# Patient Record
Sex: Male | Born: 1980 | Race: Black or African American | Hispanic: No | Marital: Single | Smoking: Never smoker
Health system: Southern US, Community
[De-identification: ages and names within clinical notes are randomized; demographics above are authoritative.]

## PROBLEM LIST (undated history)

## (undated) DIAGNOSIS — I10 Essential (primary) hypertension: Secondary | ICD-10-CM

## (undated) DIAGNOSIS — W19XXXA Unspecified fall, initial encounter: Secondary | ICD-10-CM

## (undated) HISTORY — PX: FINGER SURGERY: SHX640

---

## 2000-12-28 ENCOUNTER — Emergency Department (HOSPITAL_COMMUNITY): Admission: EM | Admit: 2000-12-28 | Discharge: 2000-12-28 | Payer: Self-pay | Admitting: Emergency Medicine

## 2000-12-28 ENCOUNTER — Encounter: Payer: Self-pay | Admitting: Emergency Medicine

## 2003-08-03 ENCOUNTER — Emergency Department (HOSPITAL_COMMUNITY): Admission: EM | Admit: 2003-08-03 | Discharge: 2003-08-03 | Payer: Self-pay | Admitting: Emergency Medicine

## 2003-08-03 ENCOUNTER — Encounter: Payer: Self-pay | Admitting: *Deleted

## 2006-05-29 ENCOUNTER — Emergency Department (HOSPITAL_COMMUNITY): Admission: EM | Admit: 2006-05-29 | Discharge: 2006-05-29 | Payer: Self-pay | Admitting: Emergency Medicine

## 2006-09-12 ENCOUNTER — Emergency Department (HOSPITAL_COMMUNITY): Admission: EM | Admit: 2006-09-12 | Discharge: 2006-09-12 | Payer: Self-pay | Admitting: Emergency Medicine

## 2007-02-07 ENCOUNTER — Emergency Department (HOSPITAL_COMMUNITY): Admission: EM | Admit: 2007-02-07 | Discharge: 2007-02-07 | Payer: Self-pay | Admitting: Emergency Medicine

## 2007-05-13 ENCOUNTER — Emergency Department (HOSPITAL_COMMUNITY): Admission: EM | Admit: 2007-05-13 | Discharge: 2007-05-13 | Payer: Self-pay | Admitting: Emergency Medicine

## 2009-02-16 ENCOUNTER — Emergency Department (HOSPITAL_COMMUNITY): Admission: EM | Admit: 2009-02-16 | Discharge: 2009-02-16 | Payer: Self-pay | Admitting: Emergency Medicine

## 2009-02-19 ENCOUNTER — Emergency Department (HOSPITAL_COMMUNITY): Admission: EM | Admit: 2009-02-19 | Discharge: 2009-02-19 | Payer: Self-pay | Admitting: Emergency Medicine

## 2009-02-25 ENCOUNTER — Emergency Department (HOSPITAL_COMMUNITY): Admission: EM | Admit: 2009-02-25 | Discharge: 2009-02-26 | Payer: Self-pay | Admitting: Emergency Medicine

## 2011-03-18 LAB — DIFFERENTIAL
Basophils Absolute: 0 10*3/uL (ref 0.0–0.1)
Basophils Relative: 0 % (ref 0–1)
Eosinophils Absolute: 0.3 10*3/uL (ref 0.0–0.7)
Eosinophils Relative: 4 % (ref 0–5)
Monocytes Absolute: 0.4 10*3/uL (ref 0.1–1.0)
Monocytes Relative: 6 % (ref 3–12)
Neutro Abs: 6 10*3/uL (ref 1.7–7.7)

## 2011-03-18 LAB — URINALYSIS, ROUTINE W REFLEX MICROSCOPIC
Bilirubin Urine: NEGATIVE
Glucose, UA: NEGATIVE mg/dL
Hgb urine dipstick: NEGATIVE
Nitrite: NEGATIVE
Protein, ur: NEGATIVE mg/dL
Specific Gravity, Urine: 1.014 (ref 1.005–1.030)
Urobilinogen, UA: 0.2 mg/dL (ref 0.0–1.0)
pH: 6 (ref 5.0–8.0)

## 2011-03-18 LAB — CBC
HCT: 42.3 % (ref 39.0–52.0)
Hemoglobin: 14.3 g/dL (ref 13.0–17.0)
MCHC: 33.8 g/dL (ref 30.0–36.0)
MCV: 81.9 fL (ref 78.0–100.0)
Platelets: 158 10*3/uL (ref 150–400)
RBC: 5.16 MIL/uL (ref 4.22–5.81)
RDW: 14.1 % (ref 11.5–15.5)
WBC: 7.1 10*3/uL (ref 4.0–10.5)

## 2011-03-18 LAB — POCT I-STAT, CHEM 8
Calcium, Ion: 1.09 mmol/L — ABNORMAL LOW (ref 1.12–1.32)
Creatinine, Ser: 1 mg/dL (ref 0.4–1.5)
Glucose, Bld: 105 mg/dL — ABNORMAL HIGH (ref 70–99)
HCT: 44 % (ref 39.0–52.0)
Hemoglobin: 15 g/dL (ref 13.0–17.0)
TCO2: 21 mmol/L (ref 0–100)

## 2011-05-12 ENCOUNTER — Emergency Department (HOSPITAL_COMMUNITY)
Admission: EM | Admit: 2011-05-12 | Discharge: 2011-05-12 | Disposition: A | Discharge status: Home / Self Care | Payer: Self-pay | Attending: Emergency Medicine | Admitting: Emergency Medicine

## 2011-05-12 ENCOUNTER — Emergency Department (HOSPITAL_COMMUNITY): Payer: Self-pay

## 2011-05-12 DIAGNOSIS — J069 Acute upper respiratory infection, unspecified: Secondary | ICD-10-CM | POA: Insufficient documentation

## 2011-05-12 DIAGNOSIS — J40 Bronchitis, not specified as acute or chronic: Secondary | ICD-10-CM | POA: Insufficient documentation

## 2011-05-12 DIAGNOSIS — R059 Cough, unspecified: Secondary | ICD-10-CM | POA: Insufficient documentation

## 2011-05-12 DIAGNOSIS — I10 Essential (primary) hypertension: Secondary | ICD-10-CM | POA: Insufficient documentation

## 2011-05-12 DIAGNOSIS — R05 Cough: Secondary | ICD-10-CM | POA: Insufficient documentation

## 2013-01-15 ENCOUNTER — Encounter (HOSPITAL_COMMUNITY): Payer: Self-pay | Admitting: Emergency Medicine

## 2013-01-15 ENCOUNTER — Emergency Department (HOSPITAL_COMMUNITY)
Admission: EM | Admit: 2013-01-15 | Discharge: 2013-01-15 | Disposition: A | Discharge status: Home / Self Care | Payer: Self-pay | Attending: Emergency Medicine | Admitting: Emergency Medicine

## 2013-01-15 DIAGNOSIS — Z79899 Other long term (current) drug therapy: Secondary | ICD-10-CM | POA: Insufficient documentation

## 2013-01-15 DIAGNOSIS — R0789 Other chest pain: Secondary | ICD-10-CM | POA: Insufficient documentation

## 2013-01-15 DIAGNOSIS — R52 Pain, unspecified: Secondary | ICD-10-CM | POA: Insufficient documentation

## 2013-01-15 DIAGNOSIS — J3489 Other specified disorders of nose and nasal sinuses: Secondary | ICD-10-CM | POA: Insufficient documentation

## 2013-01-15 DIAGNOSIS — J069 Acute upper respiratory infection, unspecified: Secondary | ICD-10-CM | POA: Insufficient documentation

## 2013-01-15 DIAGNOSIS — I10 Essential (primary) hypertension: Secondary | ICD-10-CM | POA: Insufficient documentation

## 2013-01-15 DIAGNOSIS — R6889 Other general symptoms and signs: Secondary | ICD-10-CM

## 2013-01-15 HISTORY — DX: Essential (primary) hypertension: I10

## 2013-01-15 MED ORDER — HYDROCOD POLST-CHLORPHEN POLST 10-8 MG/5ML PO LQCR
5.0000 mL | Freq: Once | ORAL | Status: AC
  Administered 2013-01-15: 5 mL via ORAL
  Filled 2013-01-15: qty 5

## 2013-01-15 MED ORDER — FLUTICASONE PROPIONATE 50 MCG/ACT NA SUSP: 2.0000 | Freq: Every day | NASAL | Status: DC

## 2013-01-15 MED ORDER — IBUPROFEN 400 MG PO TABS
800.0000 mg | ORAL_TABLET | Freq: Once | ORAL | Status: AC
  Administered 2013-01-15: 800 mg via ORAL
  Filled 2013-01-15: qty 2

## 2013-01-15 MED ORDER — HYDROCODONE-HOMATROPINE 5-1.5 MG/5ML PO SYRP: 5.0000 mL | ORAL_SOLUTION | Freq: Four times a day (QID) | ORAL | Status: DC | PRN

## 2013-01-15 MED ORDER — NAPROXEN 500 MG PO TABS: 500.0000 mg | ORAL_TABLET | Freq: Two times a day (BID) | ORAL | Status: DC

## 2013-01-15 NOTE — ED Provider Notes (Signed)
History     CSN: [id number]  Arrival date & time 01/15/13  4401   First MD Initiated Contact with Patient 01/15/13 1014      Chief Complaint  Patient presents with  . URI  . Cough  . Generalized Body Aches    (Consider location/radiation/quality/duration/timing/severity/associated sxs/prior treatment) Patient is a 32 y.o. male presenting with URI. The history is provided by the spouse.  URI Presenting symptoms: congestion, cough and rhinorrhea   Presenting symptoms: no ear pain, no facial pain, no fatigue, no fever and no sore throat   Associated symptoms: myalgias   Associated symptoms: no arthralgias, no headaches, no neck pain, no sinus pain, no sneezing, no swollen glands and no wheezing     Past Medical History  Diagnosis Date  . Hypertension     History reviewed. No pertinent past surgical history.  History reviewed. No pertinent family history.  History  Substance Use Topics  . Smoking status: Never Smoker   . Smokeless tobacco: Not on file  . Alcohol Use: Yes     Comment: occ      Review of Systems  Constitutional: Negative for fever, chills, appetite change and fatigue.  HENT: Positive for congestion and rhinorrhea. Negative for hearing loss, ear pain, nosebleeds, sore throat, sneezing, trouble swallowing, neck pain, neck stiffness, voice change, postnasal drip, sinus pressure, tinnitus and ear discharge.   Eyes: Negative for photophobia and visual disturbance.  Respiratory: Positive for cough and chest tightness. Negative for apnea, choking, shortness of breath, wheezing and stridor.   Cardiovascular: Negative for chest pain, palpitations and leg swelling.  Gastrointestinal: Negative for nausea, vomiting, abdominal pain, diarrhea and constipation.  Genitourinary: Negative for dysuria, urgency and flank pain.  Musculoskeletal: Positive for myalgias. Negative for arthralgias.  Neurological: Negative for dizziness, seizures, syncope, weakness,  light-headedness, numbness and headaches.  Psychiatric/Behavioral: Negative for behavioral problems and confusion.  All other systems reviewed and are negative.    Allergies  Acetaminophen and Septra  Home Medications   Current Outpatient Rx  Name  Route  Sig  Dispense  Refill  . calcium carbonate (OS-CAL) 600 MG TABS   Oral   Take 600 mg by mouth 2 (two) times daily with a meal.         . vitamin C (ASCORBIC ACID) 500 MG tablet   Oral   Take 500 mg by mouth daily.           BP 150/75  Pulse 55  Temp(Src) 98.4 F (36.9 C) (Oral)  Resp 20  SpO2 98%  Physical Exam  Nursing note and vitals reviewed. Constitutional: He is oriented to person, place, and time. He appears well-developed and well-nourished. No distress.  HENT:  Head: Normocephalic and atraumatic.  Nasal congestion & rhinorrhea. Mild ttp over frontal & maxilla sinuses. Normal L & R external ear canals and TMs. No tragal or mastoid tenderness. Oropharynx moist, without tonsillar exudate.   Eyes: Pupils are equal, round, and reactive to light.  No pain w EOM. Conjunctiva normal    Neck: Normal range of motion.  Soft, no nuchal rigidity. No lymphadenopathy  Cardiovascular: Normal heart sounds and intact distal pulses.   RRR, no aberrancy on auscultation  Pulmonary/Chest: Effort normal.  LCAB, no respiratory distress  Musculoskeletal: Normal range of motion.  Neurological: He is alert and oriented to person, place, and time.  Skin: He is not diaphoretic.  No rash  Psychiatric: His behavior is normal.    ED Course  Procedures (including critical  care time)  Labs Reviewed - No data to display No results found.   No diagnosis found.    MDM  Pt to ER with flulike s/s. LCAB with normal VS on exam, imaging not indicated at this time. Patients symptoms are consistent with URI, likely viral etiology. Discussed that antibiotics are not indicated for viral infections. Pt will be discharged with  symptomatic treatment.  Verbalizes understanding and is agreeable with plan. Pt is hemodynamically stable & in NAD prior to dc.         Jaci Carrel, [place] 01/15/13 1049

## 2013-01-15 NOTE — ED Notes (Signed)
Pt c/o dry cough x 3 days with body aches

## 2013-01-15 NOTE — ED Provider Notes (Signed)
Medical screening examination/treatment/procedure(s) were performed by non-physician practitioner and as supervising physician I was immediately available for consultation/collaboration.   Makailee Nudelman M Caitlin Ainley, DO 01/15/13 2117 

## 2013-03-29 ENCOUNTER — Encounter (HOSPITAL_COMMUNITY): Payer: Self-pay | Admitting: *Deleted

## 2013-03-29 ENCOUNTER — Emergency Department (HOSPITAL_COMMUNITY)
Admission: EM | Admit: 2013-03-29 | Discharge: 2013-03-29 | Disposition: A | Discharge status: Home / Self Care | Payer: Self-pay | Attending: Emergency Medicine | Admitting: Emergency Medicine

## 2013-03-29 DIAGNOSIS — Z79899 Other long term (current) drug therapy: Secondary | ICD-10-CM | POA: Insufficient documentation

## 2013-03-29 DIAGNOSIS — IMO0002 Reserved for concepts with insufficient information to code with codable children: Secondary | ICD-10-CM | POA: Insufficient documentation

## 2013-03-29 DIAGNOSIS — R062 Wheezing: Secondary | ICD-10-CM | POA: Insufficient documentation

## 2013-03-29 DIAGNOSIS — J3489 Other specified disorders of nose and nasal sinuses: Secondary | ICD-10-CM | POA: Insufficient documentation

## 2013-03-29 DIAGNOSIS — R21 Rash and other nonspecific skin eruption: Secondary | ICD-10-CM | POA: Insufficient documentation

## 2013-03-29 DIAGNOSIS — L02219 Cutaneous abscess of trunk, unspecified: Secondary | ICD-10-CM | POA: Insufficient documentation

## 2013-03-29 DIAGNOSIS — R52 Pain, unspecified: Secondary | ICD-10-CM | POA: Insufficient documentation

## 2013-03-29 DIAGNOSIS — R509 Fever, unspecified: Secondary | ICD-10-CM | POA: Insufficient documentation

## 2013-03-29 DIAGNOSIS — I10 Essential (primary) hypertension: Secondary | ICD-10-CM | POA: Insufficient documentation

## 2013-03-29 DIAGNOSIS — L03311 Cellulitis of abdominal wall: Secondary | ICD-10-CM

## 2013-03-29 DIAGNOSIS — J029 Acute pharyngitis, unspecified: Secondary | ICD-10-CM | POA: Insufficient documentation

## 2013-03-29 DIAGNOSIS — L089 Local infection of the skin and subcutaneous tissue, unspecified: Secondary | ICD-10-CM | POA: Insufficient documentation

## 2013-03-29 DIAGNOSIS — J069 Acute upper respiratory infection, unspecified: Secondary | ICD-10-CM

## 2013-03-29 MED ORDER — CLINDAMYCIN HCL 300 MG PO CAPS
300.0000 mg | ORAL_CAPSULE | Freq: Once | ORAL | Status: AC
  Administered 2013-03-29: 300 mg via ORAL
  Filled 2013-03-29: qty 1

## 2013-03-29 MED ORDER — ONDANSETRON 4 MG PO TBDP
8.0000 mg | ORAL_TABLET | Freq: Once | ORAL | Status: AC
  Administered 2013-03-29: 8 mg via ORAL

## 2013-03-29 MED ORDER — CLINDAMYCIN HCL 150 MG PO CAPS: 150.0000 mg | ORAL_CAPSULE | Freq: Four times a day (QID) | ORAL | Status: DC

## 2013-03-29 MED ORDER — PROMETHAZINE HCL 25 MG PO TABS: 25.0000 mg | ORAL_TABLET | Freq: Four times a day (QID) | ORAL | Status: DC | PRN

## 2013-03-29 MED ORDER — ONDANSETRON 4 MG PO TBDP
ORAL_TABLET | ORAL | Status: AC
  Filled 2013-03-29: qty 2

## 2013-03-29 MED ORDER — IBUPROFEN 800 MG PO TABS: 800.0000 mg | ORAL_TABLET | Freq: Three times a day (TID) | ORAL | Status: DC

## 2013-03-29 MED ORDER — IBUPROFEN 800 MG PO TABS
800.0000 mg | ORAL_TABLET | Freq: Once | ORAL | Status: AC
  Administered 2013-03-29: 800 mg via ORAL
  Filled 2013-03-29: qty 1

## 2013-03-29 MED ORDER — ALBUTEROL SULFATE HFA 108 (90 BASE) MCG/ACT IN AERS
2.0000 | INHALATION_SPRAY | RESPIRATORY_TRACT | Status: DC | PRN
  Administered 2013-03-29: 2 via RESPIRATORY_TRACT
  Filled 2013-03-29: qty 6.7

## 2013-03-29 NOTE — ED Notes (Signed)
Prior to leaving pt became nauseous due to the cleocin, pt vomited.  Notified EDP Opitz.  Obtained rx for phenergan and verbal order for 8mg  ODT zofran.

## 2013-03-29 NOTE — ED Provider Notes (Signed)
History     CSN: [id number]  Arrival date & time 03/29/13  4098   First MD Initiated Contact with Patient 03/29/13 0536      No chief complaint on file.   (Consider location/radiation/quality/duration/timing/severity/associated sxs/prior treatment) HPI Hx per PT. Cough, congestion, sore throat x 3 days, his wife has had the same symptoms, now he also has chills and body aches with recurrent skin infection on his Abdominal wall. No trauma, recent travel or vomiting. He has had these before and required antibiotics for cellulitis. Symptoms moderate in severity.  Past Medical History  Diagnosis Date  . Hypertension     History reviewed. No pertinent past surgical history.  No family history on file.  History  Substance Use Topics  . Smoking status: Never Smoker   . Smokeless tobacco: Not on file  . Alcohol Use: Yes     Comment: occ      Review of Systems  Constitutional: Positive for fever and chills.  HENT: Positive for congestion and sore throat. Negative for neck pain and neck stiffness.   Eyes: Negative for pain.  Respiratory: Positive for cough and wheezing. Negative for shortness of breath.   Cardiovascular: Negative for chest pain.  Gastrointestinal: Negative for vomiting and abdominal pain.  Genitourinary: Negative for dysuria.  Musculoskeletal: Negative for back pain.  Skin: Positive for rash.  Neurological: Negative for headaches.  All other systems reviewed and are negative.    Allergies  Acetaminophen and Septra  Home Medications   Current Outpatient Rx  Name  Route  Sig  Dispense  Refill  . calcium carbonate (OS-CAL) 600 MG TABS   Oral   Take 600 mg by mouth 2 (two) times daily with a meal.         . fluticasone (FLONASE) 50 MCG/ACT nasal spray   Nasal   Place 2 sprays into the nose daily.   16 g   2   . HYDROcodone-homatropine (HYCODAN) 5-1.5 MG/5ML syrup   Oral   Take 5 mLs by mouth every 6 (six) hours as needed for cough.   120 mL  0   . naproxen (NAPROSYN) 500 MG tablet   Oral   Take 1 tablet (500 mg total) by mouth 2 (two) times daily.   30 tablet   0   . vitamin C (ASCORBIC ACID) 500 MG tablet   Oral   Take 500 mg by mouth daily.           BP 145/82  Pulse 82  Temp(Src) 99 F (37.2 C) (Oral)  SpO2 100%  Physical Exam  Constitutional: He is oriented to person, place, and time. He appears well-developed and well-nourished.  HENT:  Head: Normocephalic and atraumatic.  Mouth/Throat: Oropharynx is clear and moist.  Nasal congestion, uvula midline, no tonsillar exudates  Eyes: EOM are normal. Pupils are equal, round, and reactive to light. No scleral icterus.  Neck: Normal range of motion. Neck supple.  Cardiovascular: Normal rate, regular rhythm and intact distal pulses.   Pulmonary/Chest: Effort normal. No stridor. No respiratory distress.  Mild exp wheezes bilat, otherwise moving air well  Abdominal: Soft. Bowel sounds are normal. He exhibits no distension. There is no rebound and no guarding.  Musculoskeletal: Normal range of motion. He exhibits no edema.  Lymphadenopathy:    He has no cervical adenopathy.  Neurological: He is alert and oriented to person, place, and time.  Skin: Skin is warm and dry.  Erythematous tender area to RLQ ABD wall, no fluctuance  or pointing    ED Course  Procedures (including critical care time)  clindamycin PO  Albuterol and motrin provided   MDM  URI symptoms and also has cellulitis Abdominal wall.   Plan ABX and recheck 48 hours  Inhaler and motrin for URI  VS and nursing notes reviewed  Cellulitis precautions provided        Sunnie Nielsen, MD 03/29/13 0630

## 2013-03-29 NOTE — ED Notes (Signed)
Per Pt:  Pt st's he caught an illness from his wife.  St's he's had sore throat and cough for the past 3-4 days.  Reports productive cough.  Denies chest pain, SOB, n/v/d.  St's he took cough medicine without relief.

## 2013-06-17 ENCOUNTER — Emergency Department (HOSPITAL_COMMUNITY): Payer: Self-pay

## 2013-06-17 ENCOUNTER — Emergency Department (HOSPITAL_COMMUNITY)
Admission: EM | Admit: 2013-06-17 | Discharge: 2013-06-17 | Disposition: A | Discharge status: Home / Self Care | Payer: Self-pay | Attending: Emergency Medicine | Admitting: Emergency Medicine

## 2013-06-17 ENCOUNTER — Encounter (HOSPITAL_COMMUNITY): Payer: Self-pay | Admitting: Emergency Medicine

## 2013-06-17 DIAGNOSIS — G8929 Other chronic pain: Secondary | ICD-10-CM | POA: Insufficient documentation

## 2013-06-17 DIAGNOSIS — R112 Nausea with vomiting, unspecified: Secondary | ICD-10-CM | POA: Insufficient documentation

## 2013-06-17 DIAGNOSIS — I10 Essential (primary) hypertension: Secondary | ICD-10-CM | POA: Insufficient documentation

## 2013-06-17 DIAGNOSIS — K5900 Constipation, unspecified: Secondary | ICD-10-CM | POA: Insufficient documentation

## 2013-06-17 DIAGNOSIS — R1084 Generalized abdominal pain: Secondary | ICD-10-CM | POA: Insufficient documentation

## 2013-06-17 DIAGNOSIS — R109 Unspecified abdominal pain: Secondary | ICD-10-CM

## 2013-06-17 DIAGNOSIS — Z79899 Other long term (current) drug therapy: Secondary | ICD-10-CM | POA: Insufficient documentation

## 2013-06-17 LAB — COMPREHENSIVE METABOLIC PANEL
AST: 19 U/L (ref 0–37)
Albumin: 3.9 g/dL (ref 3.5–5.2)
Alkaline Phosphatase: 67 U/L (ref 39–117)
BUN: 9 mg/dL (ref 6–23)
Potassium: 3.7 mEq/L (ref 3.5–5.1)
Total Protein: 7.9 g/dL (ref 6.0–8.3)

## 2013-06-17 LAB — CBC WITH DIFFERENTIAL/PLATELET
Basophils Absolute: 0 10*3/uL (ref 0.0–0.1)
Basophils Relative: 0 % (ref 0–1)
Eosinophils Absolute: 0.2 10*3/uL (ref 0.0–0.7)
Hemoglobin: 14.4 g/dL (ref 13.0–17.0)
MCH: 29.6 pg (ref 26.0–34.0)
MCHC: 36.6 g/dL — ABNORMAL HIGH (ref 30.0–36.0)
Monocytes Relative: 6 % (ref 3–12)
Neutro Abs: 7.6 10*3/uL (ref 1.7–7.7)
Neutrophils Relative %: 83 % — ABNORMAL HIGH (ref 43–77)
Platelets: 158 10*3/uL (ref 150–400)
RDW: 13 % (ref 11.5–15.5)

## 2013-06-17 MED ORDER — DICYCLOMINE HCL 20 MG PO TABS: 20.0000 mg | ORAL_TABLET | Freq: Two times a day (BID) | ORAL | Status: DC

## 2013-06-17 NOTE — ED Provider Notes (Signed)
History    CSN: [id number] Arrival date & time 06/17/13  1506  First MD Initiated Contact with Patient 06/17/13 1512     Chief Complaint  Patient presents with  . Abdominal Pain  . Nausea   (Consider location/radiation/quality/duration/timing/severity/associated sxs/prior Treatment) HPI Comments: Patient presents with abdominal pain that has been ongoing for the past 2-3 years.  He reports that the pain is generalized and feels like intense cramping.  The pain is typically relieved with having a bowel movement.  He states that he does have bowel movements daily.  He reports that his bowel movements are typically soft, but occasionally hard in consistency.  He denies diarrhea. He reports that earlier today the pain became very intense, which prompted him to come to the ED.  He reports that he did have one episode of vomiting at that time.  However, he had a bowel movement and his pain has completely resolved at this time.  He denies any nausea at this time.  No fever or chills.  He reports that he has not been evaluated by a physician for this pain previously.  Patient is a 32 y.o. male presenting with abdominal pain.  Abdominal Pain Associated symptoms include abdominal pain, nausea and vomiting. Pertinent negatives include no chills, fever or urinary symptoms.   Past Medical History  Diagnosis Date  . Hypertension    History reviewed. No pertinent past surgical history. History reviewed. No pertinent family history. History  Substance Use Topics  . Smoking status: Never Smoker   . Smokeless tobacco: Not on file  . Alcohol Use: Yes     Comment: occ    Review of Systems  Constitutional: Negative for fever and chills.  Gastrointestinal: Positive for nausea, vomiting, abdominal pain and constipation. Negative for blood in stool, abdominal distention and anal bleeding.  All other systems reviewed and are negative.    Allergies  Acetaminophen and Septra  Home Medications    Current Outpatient Rx  Name  Route  Sig  Dispense  Refill  . calcium carbonate (OS-CAL) 600 MG TABS   Oral   Take 600 mg by mouth 2 (two) times daily with a meal.         . clindamycin (CLEOCIN) 150 MG capsule   Oral   Take 1 capsule (150 mg total) by mouth every 6 (six) hours.   28 capsule   0   . fluticasone (FLONASE) 50 MCG/ACT nasal spray   Nasal   Place 2 sprays into the nose daily.   16 g   2   . HYDROcodone-homatropine (HYCODAN) 5-1.5 MG/5ML syrup   Oral   Take 5 mLs by mouth every 6 (six) hours as needed for cough.   120 mL   0   . ibuprofen (ADVIL,MOTRIN) 800 MG tablet   Oral   Take 1 tablet (800 mg total) by mouth 3 (three) times daily.   21 tablet   0   . naproxen (NAPROSYN) 500 MG tablet   Oral   Take 1 tablet (500 mg total) by mouth 2 (two) times daily.   30 tablet   0   . promethazine (PHENERGAN) 25 MG tablet   Oral   Take 1 tablet (25 mg total) by mouth every 6 (six) hours as needed for nausea.   30 tablet   0   . vitamin C (ASCORBIC ACID) 500 MG tablet   Oral   Take 500 mg by mouth daily.  BP 135/83  Pulse 67  Temp(Src) 98.7 F (37.1 C) (Oral)  Resp 16  SpO2 94% Physical Exam  Nursing note and vitals reviewed. Constitutional: He appears well-developed and well-nourished. No distress.  HENT:  Head: Normocephalic and atraumatic.  Mouth/Throat: Oropharynx is clear and moist.  Neck: Normal range of motion. Neck supple.  Cardiovascular: Normal rate, regular rhythm and normal heart sounds.   Pulmonary/Chest: Effort normal and breath sounds normal.  Abdominal: Soft. Bowel sounds are normal. He exhibits no distension and no mass. There is no tenderness. There is no rebound and no guarding.  Neurological: He is alert.  Skin: Skin is warm and dry. He is not diaphoretic.  Psychiatric: He has a normal mood and affect.    ED Course  Procedures (including critical care time) Labs Reviewed  URINALYSIS, ROUTINE W REFLEX  MICROSCOPIC  CBC WITH DIFFERENTIAL  COMPREHENSIVE METABOLIC PANEL   Dg Abd Acute W/chest  06/17/2013   *RADIOLOGY REPORT*  Clinical Data: Chronic abdominal pain.  Nausea and vomiting today.  ACUTE ABDOMEN SERIES (ABDOMEN 2 VIEW & CHEST 1 VIEW)  Comparison: Plain films of the chest 05/12/2011.  Findings: Single view of the chest demonstrates clear lungs and normal heart size.  No pneumothorax or pleural fluid.  Two views of the abdomen show no free intraperitoneal air.  The bowel gas pattern is normal.  Convex left lumbar scoliosis is noted.  IMPRESSION: No acute finding.   Original Report Authenticated By: Holley Dexter, M.D.   No diagnosis found.  MDM  Patient presenting with chronic abdominal pain.  On exam today in the ED he did not have any abdominal tenderness to palpation.  He reports that pain typically resolves after having a BM.  Patient is afebrile.  Acute Abdominal series unremarkable.  Labs unremarkable.  Due to the chronic nature of the pain and the fact that he currently is not any abdominal pain, doubt surgical abdomen.  It is possible that the pain is related to IBS.  Patient given Rx for Bentyl and referred to GI.  Pascal Lux [place], PA-C 06/18/13 1323

## 2013-06-17 NOTE — ED Notes (Signed)
Per EMS: pt states when he is exerted pt has onset of abdominal pain and will get short of breath with nausea. Pt states today he tried his usual methods and did not work today. Pt states today was different because he began to feel weak all over. Stroke screen eng per EMS. CBG 113 BP 140/80 64 pulse 18 RR O2 sat 98% RA. Pt denies pain currently. Pt denies nausea. Pt alert and oriented. 20 in left hand. No medications given en route.

## 2013-06-17 NOTE — ED Notes (Signed)
Magnus Sinning, PA speaking with pt

## 2013-06-17 NOTE — ED Notes (Signed)
Pt alert and mentating appropriately. Pt given d/c teaching and follow up care instructions with prescriptions. Pt verbalizes understanding of d/c teaching and has no further questions upon d/c. NAD noted upon d/c. Pt ambulatory leaving ER with family members. Pt denies pain upon d/c.

## 2013-06-18 NOTE — ED Provider Notes (Signed)
Medical screening examination/treatment/procedure(s) were performed by non-physician practitioner and as supervising physician I was immediately available for consultation/collaboration.   Carleene Cooper III, MD 06/18/13 [phone]

## 2013-10-24 ENCOUNTER — Emergency Department (INDEPENDENT_AMBULATORY_CARE_PROVIDER_SITE_OTHER)
Admission: EM | Admit: 2013-10-24 | Discharge: 2013-10-24 | Disposition: A | Discharge status: Home / Self Care | Payer: Self-pay | Source: Home / Self Care | Attending: Family Medicine | Admitting: Family Medicine

## 2013-10-24 ENCOUNTER — Encounter (HOSPITAL_COMMUNITY): Payer: Self-pay | Admitting: Emergency Medicine

## 2013-10-24 DIAGNOSIS — J069 Acute upper respiratory infection, unspecified: Secondary | ICD-10-CM

## 2013-10-24 MED ORDER — AZITHROMYCIN 250 MG PO TABS: 250.0000 mg | ORAL_TABLET | Freq: Every day | ORAL | Status: DC

## 2013-10-24 MED ORDER — IPRATROPIUM BROMIDE 0.06 % NA SOLN: 2.0000 | Freq: Four times a day (QID) | NASAL | Status: DC

## 2013-10-24 NOTE — ED Provider Notes (Signed)
Mark Young is a 32 y.o. male who presents to Urgent Care today for 4-5 days of cough congestion sore throat and runny nose. He's tried Robitussin and warm tea. He is feeling somewhat better and is be due to return to work. Denies any chest pain or shortness of breath nausea vomiting or diarrhea. He feels well otherwise. He is allergic to Tylenol.   Past Medical History  Diagnosis Date  . Hypertension    History  Substance Use Topics  . Smoking status: Never Smoker   . Smokeless tobacco: Not on file  . Alcohol Use: Yes     Comment: occ   ROS as above Medications reviewed. No current facility-administered medications for this encounter.   Current Outpatient Prescriptions  Medication Sig Dispense Refill  . azithromycin (ZITHROMAX) 250 MG tablet Take 1 tablet (250 mg total) by mouth daily. Take first 2 tablets together, then 1 every day until finished.  6 tablet  0  . calcium carbonate (OS-CAL) 600 MG TABS Take 600 mg by mouth 2 (two) times daily with a meal.      . dicyclomine (BENTYL) 20 MG tablet Take 1 tablet (20 mg total) by mouth 2 (two) times daily.  20 tablet  0  . fluticasone (FLONASE) 50 MCG/ACT nasal spray Place 2 sprays into the nose daily.  16 g  2  . ibuprofen (ADVIL,MOTRIN) 800 MG tablet Take 800 mg by mouth daily as needed.      Marland Kitchen ipratropium (ATROVENT) 0.06 % nasal spray Place 2 sprays into both nostrils 4 (four) times daily.  15 mL  1  . promethazine (PHENERGAN) 25 MG tablet Take 1 tablet (25 mg total) by mouth every 6 (six) hours as needed for nausea.  30 tablet  0  . vitamin C (ASCORBIC ACID) 500 MG tablet Take 500 mg by mouth daily.        Exam:  BP 153/99  Pulse 76  Temp(Src) 98.9 F (37.2 C) (Oral)  Resp 16  SpO2 100% Gen: Well NAD HEENT: EOMI,  MMM, posterior pharynx with mild cobblestoning. Tympanic membranes are normal appearing bilaterally. Clear nasal discharge for Lungs: CTABL Nl WOB Heart: RRR no MRG Abd: NABS, NT, ND Exts: Non edematous BL   LE, warm and well perfused.   No results found for this or any previous visit (from the past 24 hour(s)). No results found.  Assessment and Plan: 32 y.o. male with viral URI. Plan for symptomatic management with Atrovent nasal spray. Additionally his ibuprofen as needed. We'll prescribe azithromycin for use if patient does not improve.  Discussed warning signs or symptoms. Please see discharge instructions. Patient expresses understanding.      Rodolph Bong, MD 10/24/13 [phone]

## 2013-10-24 NOTE — ED Notes (Signed)
Dr. Denyse Amass is w/pt  Pt c/o cold sxs onset 4-5 days Sxs include: cough, sneezing, congestion, runny nose Reports he feels better but needs to be cleared to go back to work Has been taking Robitussin and Hot tea w/no temp relief.  Alert w/no signs of acute distress

## 2014-05-28 ENCOUNTER — Encounter (HOSPITAL_COMMUNITY): Payer: Self-pay | Admitting: Emergency Medicine

## 2014-05-28 ENCOUNTER — Emergency Department (INDEPENDENT_AMBULATORY_CARE_PROVIDER_SITE_OTHER)
Admission: EM | Admit: 2014-05-28 | Discharge: 2014-05-28 | Disposition: A | Discharge status: Home / Self Care | Payer: Self-pay | Source: Home / Self Care | Attending: Family Medicine | Admitting: Family Medicine

## 2014-05-28 DIAGNOSIS — S335XXA Sprain of ligaments of lumbar spine, initial encounter: Secondary | ICD-10-CM

## 2014-05-28 LAB — POCT URINALYSIS DIP (DEVICE)
Bilirubin Urine: NEGATIVE
Glucose, UA: NEGATIVE mg/dL
Hgb urine dipstick: NEGATIVE
KETONES UR: NEGATIVE mg/dL
LEUKOCYTES UA: NEGATIVE
NITRITE: NEGATIVE
PROTEIN: NEGATIVE mg/dL
Specific Gravity, Urine: 1.02 (ref 1.005–1.030)
Urobilinogen, UA: 0.2 mg/dL (ref 0.0–1.0)
pH: 5.5 (ref 5.0–8.0)

## 2014-05-28 MED ORDER — CYCLOBENZAPRINE HCL 5 MG PO TABS: 5.0000 mg | ORAL_TABLET | Freq: Three times a day (TID) | ORAL | Status: DC | PRN

## 2014-05-28 MED ORDER — IBUPROFEN 800 MG PO TABS: 800.0000 mg | ORAL_TABLET | Freq: Three times a day (TID) | ORAL | Status: DC

## 2014-05-28 NOTE — ED Notes (Signed)
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  Alert w/no signs of acute distress.

## 2014-05-28 NOTE — ED Provider Notes (Signed)
CSN: [id number]     Arrival date & time 05/28/14  0831 History   First MD Initiated Contact with Patient 05/28/14 [phone]     Chief Complaint  Patient presents with  . Back Pain   (Consider location/radiation/quality/duration/timing/severity/associated sxs/prior Treatment) Patient is a 33 y.o. male presenting with back pain. The history is provided by the patient.  Back Pain Location:  Lumbar spine Quality:  Stiffness and shooting Radiates to:  Does not radiate Pain severity:  Moderate Onset quality:  Gradual Duration:  4 days Progression:  Unchanged Chronicity:  New Context: lifting heavy objects   Context comment:  Works Location managerconstruction pouring concrete etc, felt it on fri. Worsened by:  Movement and touching Associated symptoms: no abdominal pain, no dysuria, no fever, no leg pain, no numbness and no tingling     Past Medical History  Diagnosis Date  . Hypertension    History reviewed. No pertinent past surgical history. No family history on file. History  Substance Use Topics  . Smoking status: Never Smoker   . Smokeless tobacco: Not on file  . Alcohol Use: Yes     Comment: occ    Review of Systems  Constitutional: Negative for fever.  Gastrointestinal: Negative.  Negative for abdominal pain.  Genitourinary: Negative for dysuria.  Musculoskeletal: Positive for back pain. Negative for gait problem, joint swelling and myalgias.  Skin: Negative.   Neurological: Negative for tingling and numbness.    Allergies  Acetaminophen; Amoxicillin; and Septra  Home Medications   Prior to Admission medications   Medication Sig Start Date End Date Taking? Authorizing Halaina Vanduzer  azithromycin (ZITHROMAX) 250 MG tablet Take 1 tablet (250 mg total) by mouth daily. Take first 2 tablets together, then 1 every day until finished. 10/24/13   Rodolph BongEvan S Corey, MD  calcium carbonate (OS-CAL) 600 MG TABS Take 600 mg by mouth 2 (two) times daily with a meal.    Historical Ravynn Hogate, MD    cyclobenzaprine (FLEXERIL) 5 MG tablet Take 1 tablet (5 mg total) by mouth 3 (three) times daily as needed for muscle spasms. 05/28/14   Linna HoffJames D Kindl, MD  dicyclomine (BENTYL) 20 MG tablet Take 1 tablet (20 mg total) by mouth 2 (two) times daily. 06/17/13   Heather Laisure, PA-C  fluticasone (FLONASE) 50 MCG/ACT nasal spray Place 2 sprays into the nose daily. 01/15/13   Lisette Paz, PA-C  ibuprofen (ADVIL,MOTRIN) 800 MG tablet Take 800 mg by mouth daily as needed. 03/29/13   Sunnie NielsenBrian Opitz, MD  ibuprofen (ADVIL,MOTRIN) 800 MG tablet Take 1 tablet (800 mg total) by mouth 3 (three) times daily. For back pain 05/28/14   Linna HoffJames D Kindl, MD  ipratropium (ATROVENT) 0.06 % nasal spray Place 2 sprays into both nostrils 4 (four) times daily. 10/24/13   Rodolph BongEvan S Corey, MD  promethazine (PHENERGAN) 25 MG tablet Take 1 tablet (25 mg total) by mouth every 6 (six) hours as needed for nausea. 03/29/13   Sunnie NielsenBrian Opitz, MD  vitamin C (ASCORBIC ACID) 500 MG tablet Take 500 mg by mouth daily.    Historical Joshawa Dubin, MD   There were no vitals taken for this visit. Physical Exam  Nursing note and vitals reviewed. Constitutional: He is oriented to person, place, and time. He appears well-developed and well-nourished.  Abdominal: Soft. Bowel sounds are normal. There is no tenderness.  Musculoskeletal: He exhibits tenderness.       Lumbar back: He exhibits decreased range of motion, tenderness, pain and spasm. He exhibits no bony tenderness, no  swelling, no deformity and normal pulse.       Back:  Neurological: He is alert and oriented to person, place, and time.  Skin: Skin is warm and dry.    ED Course  Procedures (including critical care time) Labs Review Labs Reviewed  POCT URINALYSIS DIP (DEVICE)    Imaging Review No results found.   MDM   1. Lumbar back sprain, initial encounter        Linna HoffJames D Kindl, MD 05/28/14 [phone]

## 2015-07-22 ENCOUNTER — Emergency Department (HOSPITAL_COMMUNITY): Payer: Self-pay

## 2015-07-22 ENCOUNTER — Emergency Department (HOSPITAL_COMMUNITY)
Admission: EM | Admit: 2015-07-22 | Discharge: 2015-07-22 | Disposition: A | Discharge status: Home / Self Care | Payer: Self-pay | Attending: Emergency Medicine | Admitting: Emergency Medicine

## 2015-07-22 ENCOUNTER — Encounter (HOSPITAL_COMMUNITY): Payer: Self-pay | Admitting: Emergency Medicine

## 2015-07-22 DIAGNOSIS — I10 Essential (primary) hypertension: Secondary | ICD-10-CM | POA: Insufficient documentation

## 2015-07-22 DIAGNOSIS — Z880 Allergy status to penicillin: Secondary | ICD-10-CM | POA: Insufficient documentation

## 2015-07-22 DIAGNOSIS — J069 Acute upper respiratory infection, unspecified: Secondary | ICD-10-CM | POA: Insufficient documentation

## 2015-07-22 DIAGNOSIS — Z79899 Other long term (current) drug therapy: Secondary | ICD-10-CM | POA: Insufficient documentation

## 2015-07-22 MED ORDER — AZITHROMYCIN 250 MG PO TABS: ORAL_TABLET | ORAL | Status: DC

## 2015-07-22 MED ORDER — GUAIFENESIN-CODEINE 100-10 MG/5ML PO SOLN: 5.0000 mL | Freq: Four times a day (QID) | ORAL | Status: DC | PRN

## 2015-07-22 NOTE — Discharge Instructions (Signed)
Your cxr showed no abnormalities. You appear to have an upper respiratory infection (URI). An upper respiratory tract infection, or cold, is a viral infection of the air passages leading to the lungs. It is contagious and can be spread to others, especially during the first 3 or 4 days. It cannot be cured by antibiotics or other medicines. RETURN IMMEDIATELY IF you develop shortness of breath, confusion or altered mental status, a new rash, become dizzy, faint, or poorly responsive, or are unable to be cared for at home.   Upper Respiratory Infection, Adult An upper respiratory infection (URI) is also sometimes known as the common cold. The upper respiratory tract includes the nose, sinuses, throat, trachea, and bronchi. Bronchi are the airways leading to the lungs. Most people improve within 1 week, but symptoms can last up to 2 weeks. A residual cough may last even longer.  CAUSES Many different viruses can infect the tissues lining the upper respiratory tract. The tissues become irritated and inflamed and often become very moist. Mucus production is also common. A cold is contagious. You can easily spread the virus to others by oral contact. This includes kissing, sharing a glass, coughing, or sneezing. Touching your mouth or nose and then touching a surface, which is then touched by another person, can also spread the virus. SYMPTOMS  Symptoms typically develop 1 to 3 days after you come in contact with a cold virus. Symptoms vary from person to person. They may include:  Runny nose.  Sneezing.  Nasal congestion.  Sinus irritation.  Sore throat.  Loss of voice (laryngitis).  Cough.  Fatigue.  Muscle aches.  Loss of appetite.  Headache.  Low-grade fever. DIAGNOSIS  You might diagnose your own cold based on familiar symptoms, since most people get a cold 2 to 3 times a year. Your caregiver can confirm this based on your exam. Most importantly, your caregiver can check that your  symptoms are not due to another disease such as strep throat, sinusitis, pneumonia, asthma, or epiglottitis. Blood tests, throat tests, and X-rays are not necessary to diagnose a common cold, but they may sometimes be helpful in excluding other more serious diseases. Your caregiver will decide if any further tests are required. RISKS AND COMPLICATIONS  You may be at risk for a more severe case of the common cold if you smoke cigarettes, have chronic heart disease (such as heart failure) or lung disease (such as asthma), or if you have a weakened immune system. The very young and very old are also at risk for more serious infections. Bacterial sinusitis, middle ear infections, and bacterial pneumonia can complicate the common cold. The common cold can worsen asthma and chronic obstructive pulmonary disease (COPD). Sometimes, these complications can require emergency medical care and may be life-threatening. PREVENTION  The best way to protect against getting a cold is to practice good hygiene. Avoid oral or hand contact with people with cold symptoms. Wash your hands often if contact occurs. There is no clear evidence that vitamin C, vitamin E, echinacea, or exercise reduces the chance of developing a cold. However, it is always recommended to get plenty of rest and practice good nutrition. TREATMENT  Treatment is directed at relieving symptoms. There is no cure. Antibiotics are not effective, because the infection is caused by a virus, not by bacteria. Treatment may include:  Increased fluid intake. Sports drinks offer valuable electrolytes, sugars, and fluids.  Breathing heated mist or steam (vaporizer or shower).  Eating chicken soup or  other clear broths, and maintaining good nutrition.  Getting plenty of rest.  Using gargles or lozenges for comfort.  Controlling fevers with ibuprofen or acetaminophen as directed by your caregiver.  Increasing usage of your inhaler if you have asthma. Zinc  gel and zinc lozenges, taken in the first 24 hours of the common cold, can shorten the duration and lessen the severity of symptoms. Pain medicines may help with fever, muscle aches, and throat pain. A variety of non-prescription medicines are available to treat congestion and runny nose. Your caregiver can make recommendations and may suggest nasal or lung inhalers for other symptoms.  HOME CARE INSTRUCTIONS   Only take over-the-counter or prescription medicines for pain, discomfort, or fever as directed by your caregiver.  Use a warm mist humidifier or inhale steam from a shower to increase air moisture. This may keep secretions moist and make it easier to breathe.  Drink enough water and fluids to keep your urine clear or pale yellow.  Rest as needed.  Return to work when your temperature has returned to normal or as your caregiver advises. You may need to stay home longer to avoid infecting others. You can also use a face mask and careful hand washing to prevent spread of the virus. SEEK MEDICAL CARE IF:   After the first few days, you feel you are getting worse rather than better.  You need your caregiver's advice about medicines to control symptoms.  You develop chills, worsening shortness of breath, or brown or red sputum. These may be signs of pneumonia.  You develop yellow or brown nasal discharge or pain in the face, especially when you bend forward. These may be signs of sinusitis.  You develop a fever, swollen neck glands, pain with swallowing, or white areas in the back of your throat. These may be signs of strep throat. SEEK IMMEDIATE MEDICAL CARE IF:   You have a fever.  You develop severe or persistent headache, ear pain, sinus pain, or chest pain.  You develop wheezing, a prolonged cough, cough up blood, or have a change in your usual mucus (if you have chronic lung disease).  You develop sore muscles or a stiff neck. Document Released: 05/18/2001 Document Revised:  02/14/2012 Document Reviewed: 02/27/2014 [facility] Patient Information 2015 [place], [place]. This information is not intended to replace advice given to you by your health care provider. Make sure you discuss any questions you have with your health care provider.  DASH Eating Plan DASH stands for "Dietary Approaches to Stop Hypertension." The DASH eating plan is a healthy eating plan that has been shown to reduce high blood pressure (hypertension). Additional health benefits may include reducing the risk of type 2 diabetes mellitus, heart disease, and stroke. The DASH eating plan may also help with weight loss. WHAT DO I NEED TO KNOW ABOUT THE DASH EATING PLAN? For the DASH eating plan, you will follow these general guidelines:  Choose foods with a percent daily value for sodium of less than 5% (as listed on the food label).  Use salt-free seasonings or herbs instead of table salt or sea salt.  Check with your health care provider or pharmacist before using salt substitutes.  Eat lower-sodium products, often labeled as "lower sodium" or "no salt added."  Eat fresh foods.  Eat more vegetables, fruits, and low-fat dairy products.  Choose whole grains. Look for the word "whole" as the first word in the ingredient list.  Choose fish and skinless chicken or [place] more often  than red meat. Limit fish, poultry, and meat to 6 oz (170 g) each day.  Limit sweets, desserts, sugars, and sugary drinks.  Choose heart-healthy fats.  Limit cheese to 1 oz (28 g) per day.  Eat more home-cooked food and less restaurant, buffet, and fast food.  Limit fried foods.  Cook foods using methods other than frying.  Limit canned vegetables. If you do use them, rinse them well to decrease the sodium.  When eating at a restaurant, ask that your food be prepared with less salt, or no salt if possible. WHAT FOODS CAN I EAT? Seek help from a dietitian for individual calorie needs. Grains Whole grain or  whole wheat bread. Brown rice. Whole grain or whole wheat pasta. Quinoa, bulgur, and whole grain cereals. Low-sodium cereals. Corn or whole wheat flour tortillas. Whole grain cornbread. Whole grain crackers. Low-sodium crackers. Vegetables Fresh or frozen vegetables (raw, steamed, roasted, or grilled). Low-sodium or reduced-sodium tomato and vegetable juices. Low-sodium or reduced-sodium tomato sauce and paste. Low-sodium or reduced-sodium canned vegetables.  Fruits All fresh, canned (in natural juice), or frozen fruits. Meat and Other Protein Products Ground beef (85% or leaner), grass-fed beef, or beef trimmed of fat. Skinless chicken or [place]. Ground chicken or [place]. Pork trimmed of fat. All fish and seafood. Eggs. Dried beans, peas, or lentils. Unsalted nuts and seeds. Unsalted canned beans. Dairy Low-fat dairy products, such as skim or 1% milk, 2% or reduced-fat cheeses, low-fat ricotta or cottage cheese, or plain low-fat yogurt. Low-sodium or reduced-sodium cheeses. Fats and Oils Tub margarines without trans fats. Light or reduced-fat mayonnaise and salad dressings (reduced sodium). Avocado. Safflower, olive, or canola oils. Natural peanut or almond butter. Other Unsalted popcorn and pretzels. The items listed above may not be a complete list of recommended foods or beverages. Contact your dietitian for more options. WHAT FOODS ARE NOT RECOMMENDED? Grains White bread. White pasta. White rice. Refined cornbread. Bagels and croissants. Crackers that contain trans fat. Vegetables Creamed or fried vegetables. Vegetables in a cheese sauce. Regular canned vegetables. Regular canned tomato sauce and paste. Regular tomato and vegetable juices. Fruits Dried fruits. Canned fruit in light or heavy syrup. Fruit juice. Meat and Other Protein Products Fatty cuts of meat. Ribs, chicken wings, bacon, sausage, bologna, salami, chitterlings, fatback, hot dogs, bratwurst, and packaged luncheon meats.  Salted nuts and seeds. Canned beans with salt. Dairy Whole or 2% milk, cream, half-and-half, and cream cheese. Whole-fat or sweetened yogurt. Full-fat cheeses or blue cheese. Nondairy creamers and whipped toppings. Processed cheese, cheese spreads, or cheese curds. Condiments Onion and garlic salt, seasoned salt, table salt, and sea salt. Canned and packaged gravies. Worcestershire sauce. Tartar sauce. Barbecue sauce. Teriyaki sauce. Soy sauce, including reduced sodium. Steak sauce. Fish sauce. Oyster sauce. Cocktail sauce. Horseradish. Ketchup and mustard. Meat flavorings and tenderizers. Bouillon cubes. Hot sauce. Tabasco sauce. Marinades. Taco seasonings. Relishes. Fats and Oils Butter, stick margarine, lard, shortening, ghee, and bacon fat. Coconut, palm kernel, or palm oils. Regular salad dressings. Other Pickles and olives. Salted popcorn and pretzels. The items listed above may not be a complete list of foods and beverages to avoid. Contact your dietitian for more information. WHERE CAN I FIND MORE INFORMATION? National Heart, Lung, and Blood Institute: [employer] Document Released: 11/11/2011 Document Revised: 04/08/2014 Document Reviewed: 09/26/2013 [facility] Patient Information 2015 [place], [place]. This information is not intended to replace advice given to you by your health care provider. Make sure you discuss any questions you have with your health  care provider.  Hypertension Hypertension, commonly called high blood pressure, is when the force of blood pumping through your arteries is too strong. Your arteries are the blood vessels that carry blood from your heart throughout your body. A blood pressure reading consists of a higher number over a lower number, such as 110/72. The higher number (systolic) is the pressure inside your arteries when your heart pumps. The lower number (diastolic) is the pressure inside your arteries when your heart  relaxes. Ideally you want your blood pressure below 120/80. Hypertension forces your heart to work harder to pump blood. Your arteries may become narrow or stiff. Having hypertension puts you at risk for heart disease, stroke, and other problems.  RISK FACTORS Some risk factors for high blood pressure are controllable. Others are not.  Risk factors you cannot control include:   Race. You may be at higher risk if you are African American.  Age. Risk increases with age.  Gender. Men are at higher risk than women before age 6 years. After age 52, women are at higher risk than men. Risk factors you can control include:  Not getting enough exercise or physical activity.  Being overweight.  Getting too much fat, sugar, calories, or salt in your diet.  Drinking too much alcohol. SIGNS AND SYMPTOMS Hypertension does not usually cause signs or symptoms. Extremely high blood pressure (hypertensive crisis) may cause headache, anxiety, shortness of breath, and nosebleed. DIAGNOSIS  To check if you have hypertension, your health care provider will measure your blood pressure while you are seated, with your arm held at the level of your heart. It should be measured at least twice using the same arm. Certain conditions can cause a difference in blood pressure between your right and left arms. A blood pressure reading that is higher than normal on one occasion does not mean that you need treatment. If one blood pressure reading is high, ask your health care provider about having it checked again. TREATMENT  Treating high blood pressure includes making lifestyle changes and possibly taking medicine. Living a healthy lifestyle can help lower high blood pressure. You may need to change some of your habits. Lifestyle changes may include:  Following the DASH diet. This diet is high in fruits, vegetables, and whole grains. It is low in salt, red meat, and added sugars.  Getting at least 2 hours of brisk  physical activity every week.  Losing weight if necessary.  Not smoking.  Limiting alcoholic beverages.  Learning ways to reduce stress. If lifestyle changes are not enough to get your blood pressure under control, your health care provider may prescribe medicine. You may need to take more than one. Work closely with your health care provider to understand the risks and benefits. HOME CARE INSTRUCTIONS  Have your blood pressure rechecked as directed by your health care provider.   Take medicines only as directed by your health care provider. Follow the directions carefully. Blood pressure medicines must be taken as prescribed. The medicine does not work as well when you skip doses. Skipping doses also puts you at risk for problems.   Do not smoke.   Monitor your blood pressure at home as directed by your health care provider. SEEK MEDICAL CARE IF:   You think you are having a reaction to medicines taken.  You have recurrent headaches or feel dizzy.  You have swelling in your ankles.  You have trouble with your vision. SEEK IMMEDIATE MEDICAL CARE IF:  You develop a  severe headache or confusion.  You have unusual weakness, numbness, or feel faint.  You have severe chest or abdominal pain.  You vomit repeatedly.  You have trouble breathing. MAKE SURE YOU:   Understand these instructions.  Will watch your condition.  Will get help right away if you are not doing well or get worse. Document Released: 11/22/2005 Document Revised: 04/08/2014 Document Reviewed: 09/14/2013 [facility] Patient Information 2015 [place], [place]. This information is not intended to replace advice given to you by your health care provider. Make sure you discuss any questions you have with your health care provider.

## 2015-07-22 NOTE — ED Provider Notes (Signed)
CSN: [id number]     Arrival date & time 07/22/15  1478 History   First MD Initiated Contact with Patient 07/22/15 [phone]     Chief Complaint  Patient presents with  . Cough  . Sore Throat     (Consider location/radiation/quality/duration/timing/severity/associated sxs/prior Treatment) HPI  Mark Young Is a 34 year old male with no significant past medical history presents emergency Department with chief complaint of cough symptoms. Patient complains of 4 days of cough, sore throat, myalgias and general malaise. He has had associated headaches. Cough is nonproductive. He also has some nasal congestion. Patient has been using these without significant improvement. Patient concerned because he works in a [employer] facility and there is a lot of particulate inhalation even though he uses a filtrate masks. Patient denies fever, nausea, vomiting, rashes, chest pain, shortness of breath, history of asthma, urinary or abdominal symptoms. Past Medical History  Diagnosis Date  . Hypertension    History reviewed. No pertinent past surgical history. No family history on file. Social History  Substance Use Topics  . Smoking status: Never Smoker   . Smokeless tobacco: None  . Alcohol Use: Yes     Comment: occ    Review of Systems  Ten systems reviewed and are negative for acute change, except as noted in the HPI.    Allergies  Acetaminophen; Amoxicillin; and Septra  Home Medications   Prior to Admission medications   Medication Sig Start Date End Date Taking? Authorizing Provider  azithromycin (ZITHROMAX Z-PAK) 250 MG tablet 2 po day one, then 1 daily x 4 days 07/22/15   Arthor Captain, PA-C  calcium carbonate (OS-CAL) 600 MG TABS Take 600 mg by mouth 2 (two) times daily with a meal.    Historical Provider, MD  cyclobenzaprine (FLEXERIL) 5 MG tablet Take 1 tablet (5 mg total) by mouth 3 (three) times daily as needed for muscle spasms. 05/28/14   Linna Hoff, MD  dicyclomine  (BENTYL) 20 MG tablet Take 1 tablet (20 mg total) by mouth 2 (two) times daily. 06/17/13   Heather Laisure, PA-C  fluticasone (FLONASE) 50 MCG/ACT nasal spray Place 2 sprays into the nose daily. 01/15/13   Lisette Paz, PA-C  guaiFENesin-codeine 100-10 MG/5ML syrup Take 5-10 mLs by mouth every 6 (six) hours as needed for cough. 07/22/15   Arthor Captain, PA-C  ibuprofen (ADVIL,MOTRIN) 800 MG tablet Take 800 mg by mouth daily as needed. 03/29/13   Sunnie Nielsen, MD  ibuprofen (ADVIL,MOTRIN) 800 MG tablet Take 1 tablet (800 mg total) by mouth 3 (three) times daily. For back pain 05/28/14   Linna Hoff, MD  ipratropium (ATROVENT) 0.06 % nasal spray Place 2 sprays into both nostrils 4 (four) times daily. 10/24/13   Rodolph Bong, MD  promethazine (PHENERGAN) 25 MG tablet Take 1 tablet (25 mg total) by mouth every 6 (six) hours as needed for nausea. 03/29/13   Sunnie Nielsen, MD  vitamin C (ASCORBIC ACID) 500 MG tablet Take 500 mg by mouth daily.    Historical Provider, MD   BP 160/96 mmHg  Pulse 66  Temp(Src) 98.3 F (36.8 C) (Oral)  Resp 18  SpO2 97% Physical Exam Appears moderately ill but not toxic; temperature as noted in vitals. Ears normal. Eyes:glassy appearance, no discharge  Heart: RRR, NO M/G/R Throat and pharynx normal.   Neck supple. No adenopathyhy in the neck.  Sinuses non tender.  The chest is clear. Abdomen is soft and nontendeR  ED Course  Procedures (including critical  care time) Labs Review Labs Reviewed - No data to display  Imaging Review Dg Chest 2 View  07/22/2015   CLINICAL DATA:  Cough and chest discomfort  EXAM: CHEST  2 VIEW  COMPARISON:  June 17, 2013  FINDINGS: There is no edema or consolidation. Heart is upper normal in size with pulmonary vascularity within normal limits. No adenopathy. No bone lesions.  IMPRESSION: No edema or consolidation.   Electronically Signed   By: Bretta Bang III M.D.   On: 07/22/2015 08:23   I, Arthor Captain, personally reviewed and  evaluated these images and lab results as part of my medical decision-making.   EKG Interpretation None      MDM   Final diagnoses:  URI (upper respiratory infection)  Essential hypertension    8:34 AM BP 160/96 mmHg  Pulse 66  Temp(Src) 98.3 F (36.8 C) (Oral)  Resp 18  SpO2 97% Pt CXR negative for acute infiltrate. Patients symptoms are consistent with URI, likely viral etiology. Discussed that antibiotics are not indicated for viral infections.Will d/c with azithromax to hold if ss get worse. Pt will be discharged with symptomatic treatment.  Verbalizes understanding and is agreeable with plan. Pt is hemodynamically stable & in NAD prior to dc.     Arthor Captain, PA-C 07/22/15 1610  Linwood Dibbles, MD 07/22/15 1500

## 2015-07-22 NOTE — ED Notes (Signed)
Declined W/C at D/C and was escorted to lobby by RN. 

## 2015-07-22 NOTE — ED Notes (Signed)
Patient complains of cough, congestion and sore throat.   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  Patient talking in full sentences.  NAD.

## 2016-05-02 ENCOUNTER — Encounter (HOSPITAL_COMMUNITY): Payer: Self-pay

## 2016-05-02 ENCOUNTER — Emergency Department (HOSPITAL_COMMUNITY)
Admission: EM | Admit: 2016-05-02 | Discharge: 2016-05-02 | Disposition: A | Discharge status: Home / Self Care | Payer: Self-pay | Attending: Emergency Medicine | Admitting: Emergency Medicine

## 2016-05-02 DIAGNOSIS — Z880 Allergy status to penicillin: Secondary | ICD-10-CM | POA: Insufficient documentation

## 2016-05-02 DIAGNOSIS — Z7951 Long term (current) use of inhaled steroids: Secondary | ICD-10-CM | POA: Insufficient documentation

## 2016-05-02 DIAGNOSIS — Z79899 Other long term (current) drug therapy: Secondary | ICD-10-CM | POA: Insufficient documentation

## 2016-05-02 DIAGNOSIS — I10 Essential (primary) hypertension: Secondary | ICD-10-CM | POA: Insufficient documentation

## 2016-05-02 DIAGNOSIS — J029 Acute pharyngitis, unspecified: Secondary | ICD-10-CM | POA: Insufficient documentation

## 2016-05-02 DIAGNOSIS — Z791 Long term (current) use of non-steroidal anti-inflammatories (NSAID): Secondary | ICD-10-CM | POA: Insufficient documentation

## 2016-05-02 DIAGNOSIS — M7541 Impingement syndrome of right shoulder: Secondary | ICD-10-CM | POA: Insufficient documentation

## 2016-05-02 LAB — RAPID STREP SCREEN (MED CTR MEBANE ONLY): Streptococcus, Group A Screen (Direct): NEGATIVE

## 2016-05-02 MED ORDER — PENICILLIN G BENZATHINE 1200000 UNIT/2ML IM SUSP
1.2000 10*6.[IU] | Freq: Once | INTRAMUSCULAR | Status: AC
  Administered 2016-05-02: 1.2 10*6.[IU] via INTRAMUSCULAR
  Filled 2016-05-02: qty 2

## 2016-05-02 NOTE — Discharge Instructions (Signed)
Impingement Syndrome, Rotator Cuff, Bursitis With Rehab Impingement syndrome is a condition that involves inflammation of the tendons of the rotator cuff and the subacromial bursa, that causes pain in the shoulder. The rotator cuff consists of four tendons and muscles that control much of the shoulder and upper arm function. The subacromial bursa is a fluid filled sac that helps reduce friction between the rotator cuff and one of the bones of the shoulder (acromion). Impingement syndrome is usually an overuse injury that causes swelling of the bursa (bursitis), swelling of the tendon (tendonitis), and/or a tear of the tendon (strain). Strains are classified into three categories. Grade 1 strains cause pain, but the tendon is not lengthened. Grade 2 strains include a lengthened ligament, due to the ligament being stretched or partially ruptured. With grade 2 strains there is still function, although the function may be decreased. Grade 3 strains include a complete tear of the tendon or muscle, and function is usually impaired. SYMPTOMS   Pain around the shoulder, often at the outer portion of the upper arm.  Pain that gets worse with shoulder function, especially when reaching overhead or lifting.  Sometimes, aching when not using the arm.  Pain that wakes you up at night.  Sometimes, tenderness, swelling, warmth, or redness over the affected area.  Loss of strength.  Limited motion of the shoulder, especially reaching behind the back (to the back pocket or to unhook bra) or across your body.  Crackling sound (crepitation) when moving the arm.  Biceps tendon pain and inflammation (in the front of the shoulder). Worse when bending the elbow or lifting. CAUSES  Impingement syndrome is often an overuse injury, in which chronic (repetitive) motions cause the tendons or bursa to become inflamed. A strain occurs when a force is paced on the tendon or muscle that is greater than it can withstand.  Common mechanisms of injury include: Stress from sudden increase in duration, frequency, or intensity of training.  Direct hit (trauma) to the shoulder.  Aging, erosion of the tendon with normal use.  Bony bump on shoulder (acromial spur). RISK INCREASES WITH:  Contact sports (football, wrestling, boxing).  Throwing sports (baseball, tennis, volleyball).  Weightlifting and bodybuilding.  Heavy labor.  Previous injury to the rotator cuff, including impingement.  Poor shoulder strength and flexibility.  Failure to warm up properly before activity.  Inadequate protective equipment.  Old age.  Bony bump on shoulder (acromial spur). PREVENTION   Warm up and stretch properly before activity.  Allow for adequate recovery between workouts.  Maintain physical fitness:  Strength, flexibility, and endurance.  Cardiovascular fitness.  Learn and use proper exercise technique. PROGNOSIS  If treated properly, impingement syndrome usually goes away within 6 weeks. Sometimes surgery is required.  RELATED COMPLICATIONS   Longer healing time if not properly treated, or if not given enough time to heal.  Recurring symptoms, that result in a chronic condition.  Shoulder stiffness, frozen shoulder, or loss of motion.  Rotator cuff tendon tear.  Recurring symptoms, especially if activity is resumed too soon, with overuse, with a direct blow, or when using poor technique. TREATMENT  Treatment first involves the use of ice and medicine, to reduce pain and inflammation. The use of strengthening and stretching exercises may help reduce pain with activity. These exercises may be performed at home or with a therapist. If non-surgical treatment is unsuccessful after more than 6 months, surgery may be advised. After surgery and rehabilitation, activity is usually possible in 3 months.  MEDICATION  If pain medicine is needed, nonsteroidal anti-inflammatory medicines (aspirin and  ibuprofen), or other minor pain relievers (acetaminophen), are often advised.  Do not take pain medicine for 7 days before surgery.  Prescription pain relievers may be given, if your caregiver thinks they are needed. Use only as directed and only as much as you need.  Corticosteroid injections may be given by your caregiver. These injections should be reserved for the most serious cases, because they may only be given a certain number of times. HEAT AND COLD  Cold treatment (icing) should be applied for 10 to 15 minutes every 2 to 3 hours for inflammation and pain, and immediately after activity that aggravates your symptoms. Use ice packs or an ice massage.  Heat treatment may be used before performing stretching and strengthening activities prescribed by your caregiver, physical therapist, or athletic trainer. Use a heat pack or a warm water soak. SEEK MEDICAL CARE IF:   Symptoms get worse or do not improve in 4 to 6 weeks, despite treatment.  New, unexplained symptoms develop. (Drugs used in treatment may produce side effects.) EXERCISES  RANGE OF MOTION (ROM) AND STRETCHING EXERCISES - Impingement Syndrome (Rotator Cuff  Tendinitis, Bursitis) These exercises may help you when beginning to rehabilitate your injury. Your symptoms may go away with or without further involvement from your physician, physical therapist or athletic trainer. While completing these exercises, remember:   Restoring tissue flexibility helps normal motion to return to the joints. This allows healthier, less painful movement and activity.  An effective stretch should be held for at least 30 seconds.  A stretch should never be painful. You should only feel a gentle lengthening or release in the stretched tissue. STRETCH - Flexion, Standing  Stand with good posture. With an underhand grip on your right / left hand, and an overhand grip on the opposite hand, grasp a broomstick or cane so that your hands are a  little more than shoulder width apart.  Keeping your right / left elbow straight and shoulder muscles relaxed, push the stick with your opposite hand, to raise your right / left arm in front of your body and then overhead. Raise your arm until you feel a stretch in your right / left shoulder, but before you have increased shoulder pain.  Try to avoid shrugging your right / left shoulder as your arm rises, by keeping your shoulder blade tucked down and toward your mid-back spine. Hold for __________ seconds.  Slowly return to the starting position. Repeat __________ times. Complete this exercise __________ times per day. STRETCH - Abduction, Supine  Lie on your back. With an underhand grip on your right / left hand and an overhand grip on the opposite hand, grasp a broomstick or cane so that your hands are a little more than shoulder width apart.  Keeping your right / left elbow straight and your shoulder muscles relaxed, push the stick with your opposite hand, to raise your right / left arm out to the side of your body and then overhead. Raise your arm until you feel a stretch in your right / left shoulder, but before you have increased shoulder pain.  Try to avoid shrugging your right / left shoulder as your arm rises, by keeping your shoulder blade tucked down and toward your mid-back spine. Hold for __________ seconds.  Slowly return to the starting position. Repeat __________ times. Complete this exercise __________ times per day. ROM - Flexion, Active-Assisted  Lie on your back.  You may bend your knees for comfort.  Grasp a broomstick or cane so your hands are about shoulder width apart. Your right / left hand should grip the end of the stick, so that your hand is positioned "thumbs-up," as if you were about to shake hands.  Using your healthy arm to lead, raise your right / left arm overhead, until you feel a gentle stretch in your shoulder. Hold for __________ seconds.  Use the stick  to assist in returning your right / left arm to its starting position. Repeat __________ times. Complete this exercise __________ times per day.  ROM - Internal Rotation, Supine   Lie on your back on a firm surface. Place your right / left elbow about 60 degrees away from your side. Elevate your elbow with a folded towel, so that the elbow and shoulder are the same height.  Using a broomstick or cane and your strong arm, pull your right / left hand toward your body until you feel a gentle stretch, but no increase in your shoulder pain. Keep your shoulder and elbow in place throughout the exercise.  Hold for __________ seconds. Slowly return to the starting position. Repeat __________ times. Complete this exercise __________ times per day. STRETCH - Internal Rotation  Place your right / left hand behind your back, palm up.  Throw a towel or belt over your opposite shoulder. Grasp the towel with your right / left hand.  While keeping an upright posture, gently pull up on the towel, until you feel a stretch in the front of your right / left shoulder.  Avoid shrugging your right / left shoulder as your arm rises, by keeping your shoulder blade tucked down and toward your mid-back spine.  Hold for __________ seconds. Release the stretch, by lowering your healthy hand. Repeat __________ times. Complete this exercise __________ times per day. ROM - Internal Rotation   Using an underhand grip, grasp a stick behind your back with both hands.  While standing upright with good posture, slide the stick up your back until you feel a mild stretch in the front of your shoulder.  Hold for __________ seconds. Slowly return to your starting position. Repeat __________ times. Complete this exercise __________ times per day.  STRETCH - Posterior Shoulder Capsule   Stand or sit with good posture. Grasp your right / left elbow and draw it across your chest, keeping it at the same height as your  shoulder.  Pull your elbow, so your upper arm comes in closer to your chest. Pull until you feel a gentle stretch in the back of your shoulder.  Hold for __________ seconds. Repeat __________ times. Complete this exercise __________ times per day. STRENGTHENING EXERCISES - Impingement Syndrome (Rotator Cuff Tendinitis, Bursitis) These exercises may help you when beginning to rehabilitate your injury. They may resolve your symptoms with or without further involvement from your physician, physical therapist or athletic trainer. While completing these exercises, remember:  Muscles can gain both the endurance and the strength needed for everyday activities through controlled exercises.  Complete these exercises as instructed by your physician, physical therapist or athletic trainer. Increase the resistance and repetitions only as guided.  You may experience muscle soreness or fatigue, but the pain or discomfort you are trying to eliminate should never worsen during these exercises. If this pain does get worse, stop and make sure you are following the directions exactly. If the pain is still present after adjustments, discontinue the exercise until you can discuss  the trouble with your clinician.  During your recovery, avoid activity or exercises which involve actions that place your injured hand or elbow above your head or behind your back or head. These positions stress the tissues which you are trying to heal. STRENGTH - Scapular Depression and Adduction   With good posture, sit on a firm chair. Support your arms in front of you, with pillows, arm rests, or on a table top. Have your elbows in line with the sides of your body.  Gently draw your shoulder blades down and toward your mid-back spine. Gradually increase the tension, without tensing the muscles along the top of your shoulders and the back of your neck.  Hold for __________ seconds. Slowly release the tension and relax your muscles  completely before starting the next repetition.  After you have practiced this exercise, remove the arm support and complete the exercise in standing as well as sitting position. Repeat __________ times. Complete this exercise __________ times per day.  STRENGTH - Shoulder Abductors, Isometric  With good posture, stand or sit about 4-6 inches from a wall, with your right / left side facing the wall.  Bend your right / left elbow. Gently press your right / left elbow into the wall. Increase the pressure gradually, until you are pressing as hard as you can, without shrugging your shoulder or increasing any shoulder discomfort.  Hold for __________ seconds.  Release the tension slowly. Relax your shoulder muscles completely before you begin the next repetition. Repeat __________ times. Complete this exercise __________ times per day.  STRENGTH - External Rotators, Isometric  Keep your right / left elbow at your side and bend it 90 degrees.  Step into a door frame so that the outside of your right / left wrist can press against the door frame without your upper arm leaving your side.  Gently press your right / left wrist into the door frame, as if you were trying to swing the back of your hand away from your stomach. Gradually increase the tension, until you are pressing as hard as you can, without shrugging your shoulder or increasing any shoulder discomfort.  Hold for __________ seconds.  Release the tension slowly. Relax your shoulder muscles completely before you begin the next repetition. Repeat __________ times. Complete this exercise __________ times per day.  STRENGTH - Supraspinatus   Stand or sit with good posture. Grasp a __________ weight, or an exercise band or tubing, so that your hand is "thumbs-up," like you are shaking hands.  Slowly lift your right / left arm in a "V" away from your thigh, diagonally into the space between your side and straight ahead. Lift your hand to  shoulder height or as far as you can, without increasing any shoulder pain. At first, many people do not lift their hands above shoulder height.  Avoid shrugging your right / left shoulder as your arm rises, by keeping your shoulder blade tucked down and toward your mid-back spine.  Hold for __________ seconds. Control the descent of your hand, as you slowly return to your starting position. Repeat __________ times. Complete this exercise __________ times per day.  STRENGTH - External Rotators  Secure a rubber exercise band or tubing to a fixed object (table, pole) so that it is at the same height as your right / left elbow when you are standing or sitting on a firm surface.  Stand or sit so that the secured exercise band is at your uninjured side.  [employer]  your right / left elbow 90 degrees. Place a folded towel or small pillow under your right / left arm, so that your elbow is a few inches away from your side.  Keeping the tension on the exercise band, pull it away from your body, as if pivoting on your elbow. Be sure to keep your body steady, so that the movement is coming only from your rotating shoulder.  Hold for __________ seconds. Release the tension in a controlled manner, as you return to the starting position. Repeat __________ times. Complete this exercise __________ times per day.  STRENGTH - Internal Rotators   Secure a rubber exercise band or tubing to a fixed object (table, pole) so that it is at the same height as your right / left elbow when you are standing or sitting on a firm surface.  Stand or sit so that the secured exercise band is at your right / left side.  Bend your elbow 90 degrees. Place a folded towel or small pillow under your right / left arm so that your elbow is a few inches away from your side.  Keeping the tension on the exercise band, pull it across your body, toward your stomach. Be sure to keep your body steady, so that the movement is coming only from  your rotating shoulder.  Hold for __________ seconds. Release the tension in a controlled manner, as you return to the starting position. Repeat __________ times. Complete this exercise __________ times per day.  STRENGTH - Scapular Protractors, Standing   Stand arms length away from a wall. Place your hands on the wall, keeping your elbows straight.  Begin by dropping your shoulder blades down and toward your mid-back spine.  To strengthen your protractors, keep your shoulder blades down, but slide them forward on your rib cage. It will feel as if you are lifting the back of your rib cage away from the wall. This is a subtle motion and can be challenging to complete. Ask your caregiver for further instruction, if you are not sure you are doing the exercise correctly.  Hold for __________ seconds. Slowly return to the starting position, resting the muscles completely before starting the next repetition. Repeat __________ times. Complete this exercise __________ times per day. STRENGTH - Scapular Protractors, Supine  Lie on your back on a firm surface. Extend your right / left arm straight into the air while holding a __________ weight in your hand.  Keeping your head and back in place, lift your shoulder off the floor.  Hold for __________ seconds. Slowly return to the starting position, and allow your muscles to relax completely before starting the next repetition. Repeat __________ times. Complete this exercise __________ times per day. STRENGTH - Scapular Protractors, Quadruped  Get onto your hands and knees, with your shoulders directly over your hands (or as close as you can be, comfortably).  Keeping your elbows locked, lift the back of your rib cage up into your shoulder blades, so your mid-back rounds out. Keep your neck muscles relaxed.  Hold this position for __________ seconds. Slowly return to the starting position and allow your muscles to relax completely before starting the  next repetition. Repeat __________ times. Complete this exercise __________ times per day.  STRENGTH - Scapular Retractors  Secure a rubber exercise band or tubing to a fixed object (table, pole), so that it is at the height of your shoulders when you are either standing, or sitting on a firm armless chair.  With a  palm down grip, grasp an end of the band in each hand. Straighten your elbows and lift your hands straight in front of you, at shoulder height. Step back, away from the secured end of the band, until it becomes tense.  Squeezing your shoulder blades together, draw your elbows back toward your sides, as you bend them. Keep your upper arms lifted away from your body throughout the exercise.  Hold for __________ seconds. Slowly ease the tension on the band, as you reverse the directions and return to the starting position. Repeat __________ times. Complete this exercise __________ times per day. STRENGTH - Shoulder Extensors   Secure a rubber exercise band or tubing to a fixed object (table, pole) so that it is at the height of your shoulders when you are either standing, or sitting on a firm armless chair.  With a thumbs-up grip, grasp an end of the band in each hand. Straighten your elbows and lift your hands straight in front of you, at shoulder height. Step back, away from the secured end of the band, until it becomes tense.  Squeezing your shoulder blades together, pull your hands down to the sides of your thighs. Do not allow your hands to go behind you.  Hold for __________ seconds. Slowly ease the tension on the band, as you reverse the directions and return to the starting position. Repeat __________ times. Complete this exercise __________ times per day.  STRENGTH - Scapular Retractors and External Rotators   Secure a rubber exercise band or tubing to a fixed object (table, pole) so that it is at the height as your shoulders, when you are either standing, or sitting on a  firm armless chair.  With a palm down grip, grasp an end of the band in each hand. Bend your elbows 90 degrees and lift your elbows to shoulder height, at your sides. Step back, away from the secured end of the band, until it becomes tense.  Squeezing your shoulder blades together, rotate your shoulders so that your upper arms and elbows remain stationary, but your fists travel upward to head height.  Hold for __________ seconds. Slowly ease the tension on the band, as you reverse the directions and return to the starting position. Repeat __________ times. Complete this exercise __________ times per day.  STRENGTH - Scapular Retractors and External Rotators, Rowing   Secure a rubber exercise band or tubing to a fixed object (table, pole) so that it is at the height of your shoulders, when you are either standing, or sitting on a firm armless chair.  With a palm down grip, grasp an end of the band in each hand. Straighten your elbows and lift your hands straight in front of you, at shoulder height. Step back, away from the secured end of the band, until it becomes tense.  Step 1: Squeeze your shoulder blades together. Bending your elbows, draw your hands to your chest, as if you are rowing a boat. At the end of this motion, your hands and elbow should be at shoulder height and your elbows should be out to your sides.  Step 2: Rotate your shoulders, to raise your hands above your head. Your forearms should be vertical and your upper arms should be horizontal.  Hold for __________ seconds. Slowly ease the tension on the band, as you reverse the directions and return to the starting position. Repeat __________ times. Complete this exercise __________ times per day.  STRENGTH - Scapular Depressors  Find a sturdy chair  without wheels, such as a dining room chair.  Keeping your feet on the floor, and your hands on the chair arms, lift your bottom up from the seat, and lock your elbows.  Keeping  your elbows straight, allow gravity to pull your body weight down. Your shoulders will rise toward your ears.  Raise your body against gravity by drawing your shoulder blades down your back, shortening the distance between your shoulders and ears. Although your feet should always maintain contact with the floor, your feet should progressively support less body weight, as you get stronger.  Hold for __________ seconds. In a controlled and slow manner, lower your body weight to begin the next repetition. Repeat __________ times. Complete this exercise __________ times per day.    This information is not intended to replace advice given to you by your health care provider. Make sure you discuss any questions you have with your health care provider.   Document Released: 11/22/2005 Document Revised: 12/13/2014 Document Reviewed: 03/06/2009 [employer] Interactive Patient Education 2016 [employer]  Pharyngitis Pharyngitis is redness, pain, and swelling (inflammation) of your pharynx.  CAUSES  Pharyngitis is usually caused by infection. Most of the time, these infections are from viruses (viral) and are part of a cold. However, sometimes pharyngitis is caused by bacteria (bacterial). Pharyngitis can also be caused by allergies. Viral pharyngitis may be spread from person to person by coughing, sneezing, and personal items or utensils (cups, forks, spoons, toothbrushes). Bacterial pharyngitis may be spread from person to person by more intimate contact, such as kissing.  SIGNS AND SYMPTOMS  Symptoms of pharyngitis include:   Sore throat.   Tiredness (fatigue).   Low-grade fever.   Headache.  Joint pain and muscle aches.  Skin rashes.  Swollen lymph nodes.  Plaque-like film on throat or tonsils (often seen with bacterial pharyngitis). DIAGNOSIS  Your health care provider will ask you questions about your illness and your symptoms. Your medical history, along with a physical exam, is  often all that is needed to diagnose pharyngitis. Sometimes, a rapid strep test is done. Other lab tests may also be done, depending on the suspected cause.  TREATMENT  Viral pharyngitis will usually get better in 3-4 days without the use of medicine. Bacterial pharyngitis is treated with medicines that kill germs (antibiotics).  HOME CARE INSTRUCTIONS   Drink enough water and fluids to keep your urine clear or pale yellow.   Only take over-the-counter or prescription medicines as directed by your health care provider:   If you are prescribed antibiotics, make sure you finish them even if you start to feel better.   Do not take aspirin.   Get lots of rest.   Gargle with 8 oz of salt water ( tsp of salt per 1 qt of water) as often as every 1-2 hours to soothe your throat.   Throat lozenges (if you are not at risk for choking) or sprays may be used to soothe your throat. SEEK MEDICAL CARE IF:   You have large, tender lumps in your neck.  You have a rash.  You cough up green, yellow-brown, or bloody spit. SEEK IMMEDIATE MEDICAL CARE IF:   Your neck becomes stiff.  You drool or are unable to swallow liquids.  You vomit or are unable to keep medicines or liquids down.  You have severe pain that does not go away with the use of recommended medicines.  You have trouble breathing (not caused by a stuffy nose). MAKE SURE YOU:  Understand these instructions.  Will watch your condition.  Will get help right away if you are not doing well or get worse.   This information is not intended to replace advice given to you by your health care provider. Make sure you discuss any questions you have with your health care provider.   Document Released: 11/22/2005 Document Revised: 09/12/2013 Document Reviewed: 07/30/2013 Elsevier Interactive Patient Education [employer]

## 2016-05-02 NOTE — ED Provider Notes (Signed)
CSN: [id number]     Arrival date & time 05/02/16  1816 History  By signing my name below, I, Renetta Chalk, attest that this documentation has been prepared under the direction and in the presence of Roxy Horseman PA-C  Electronically Signed: Renetta Chalk, ED Scribe. 05/02/2016. 4:01 PM.  Chief Complaint  Patient presents with  . Sore Throat  . Shoulder Pain   The history is provided by the patient. No language interpreter was used.   HPI Comments: Mark Young is a 35 y.o. male with a PMHx of HTN, who presents to the Emergency Department complaining of constant, gradually worsening, sore throat, onset one week ago. Pt reports pain is worse when he swallows and yawns with no alleviating factors. Pt reports associated symptoms of subjective fever and feeling weak. Pt has allergies to Septra but no other medications.  Pt also complains of sharp, throbbing, intermittent right shoulder pain that has been ongoing for several weeks. Pt reports pain is made worse by movement with no alleviating factors. 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    Past Medical History  Diagnosis Date  . Hypertension    History reviewed. No pertinent past surgical history. History reviewed. No pertinent family history. Social History  Substance Use Topics  . Smoking status: Never Smoker   . Smokeless tobacco: None  . Alcohol Use: Yes     Comment: occ    Review of Systems  Constitutional: Positive for fever (subjective) and fatigue.  HENT: Positive for sore throat.   Musculoskeletal: Positive for arthralgias (right shoulder).      Allergies  Acetaminophen; Amoxicillin; and Septra  Home Medications   Prior to Admission medications   Medication Sig Start Date End Date Taking? Authorizing Provider  azithromycin (ZITHROMAX Z-PAK) 250 MG tablet 2 po day one, then 1 daily x 4 days 07/22/15   Arthor Captain, PA-C  calcium carbonate  (OS-CAL) 600 MG TABS Take 600 mg by mouth 2 (two) times daily with a meal.    Historical Provider, MD  cyclobenzaprine (FLEXERIL) 5 MG tablet Take 1 tablet (5 mg total) by mouth 3 (three) times daily as needed for muscle spasms. 05/28/14   Linna Hoff, MD  dicyclomine (BENTYL) 20 MG tablet Take 1 tablet (20 mg total) by mouth 2 (two) times daily. 06/17/13   Heather Laisure, PA-C  fluticasone (FLONASE) 50 MCG/ACT nasal spray Place 2 sprays into the nose daily. 01/15/13   Lisette Paz, PA-C  guaiFENesin-codeine 100-10 MG/5ML syrup Take 5-10 mLs by mouth every 6 (six) hours as needed for cough. 07/22/15   Arthor Captain, PA-C  ibuprofen (ADVIL,MOTRIN) 800 MG tablet Take 800 mg by mouth daily as needed. 03/29/13   Sunnie Nielsen, MD  ibuprofen (ADVIL,MOTRIN) 800 MG tablet Take 1 tablet (800 mg total) by mouth 3 (three) times daily. For back pain 05/28/14   Linna Hoff, MD  ipratropium (ATROVENT) 0.06 % nasal spray Place 2 sprays into both nostrils 4 (four) times daily. 10/24/13   Rodolph Bong, MD  promethazine (PHENERGAN) 25 MG tablet Take 1 tablet (25 mg total) by mouth every 6 (six) hours as needed for nausea. 03/29/13   Sunnie Nielsen, MD  vitamin C (ASCORBIC ACID) 500 MG tablet Take 500 mg by mouth daily.    Historical Provider, MD   BP 135/84 mmHg  Pulse 77  Temp(Src) 98.5 F (36.9 C) (Oral)  Resp 18  Ht  (1.88 m)  Wt 229 lb 3.2 oz (103.964 kg)  BMI 29.41 kg/m2  SpO2 97% Physical Exam  Physical Exam  Constitutional: Pt appears well-developed and well-nourished. No distress.  HENT:  Oropharynx is moderately erythematous, no exudate, no abscess Head: Normocephalic and atraumatic.  Eyes: Conjunctivae are normal.  Neck: Normal range of motion.  positive anterior cervical adenopathy Cardiovascular: Normal rate, regular rhythm and intact distal pulses.   Capillary refill < 3 sec  Pulmonary/Chest: Effort normal and breath sounds normal.  Musculoskeletal: Pt exhibits no focal tenderness with  palpation, but does exhibit positive Hawkins-Kennedy test to the right shoulder, negative arm squeeze test, no bony abnormality or deformities. Pt exhibits no edema.  ROM: 5/5  Neurological: Pt  is alert. Coordination normal.  Sensation 5/5  Strength 5/5  Skin: Skin is warm and dry. Pt is not diaphoretic.  No tenting of the skin  Psychiatric: Pt has a normal mood and affect.  Nursing note and vitals reviewed.   ED Course  Procedures  DIAGNOSTIC STUDIES: Oxygen Saturation is 97% on RA, normal by my interpretation.  COORDINATION OF CARE: 7:35 PM Discussed treatment plan which includes shoulder exercises and Penicillin with pt at bedside and pt agreed to plan.  .  MDM   Final diagnoses:  Pharyngitis  Shoulder impingement, right    Patient symptoms are consistent with pharyngitis. Patient has had subjective fevers, though no measured temperature. Denies any cough. He does have anterior cervical adenopathy. Will treat with penicillin. Additionally, patient has complaints of right shoulder pain. No injuries. Suspect shoulder impingement syndrome. Recommend orthopedic follow-up and exercises and stretching. Patient understands and agrees to plan. He is stable and ready for discharge.  I personally performed the services described in this documentation, which was scribed in my presence. The recorded information has been reviewed and is accurate.      Roxy Horsemanobert Marwan Lipe, PA-C 05/02/16 1948  Alvira MondayErin Schlossman, MD 05/03/16 1258

## 2016-05-02 NOTE — ED Notes (Signed)
Pt reports onset 1 week sore throat, no fever or cold/cough symptoms.  Pt having right shoulder pain for years when lifting arm up over head, pain getting worse.

## 2016-05-02 NOTE — ED Notes (Signed)
See EDP assessment 

## 2016-05-05 LAB — CULTURE, GROUP A STREP (THRC)

## 2016-10-05 ENCOUNTER — Emergency Department (HOSPITAL_COMMUNITY)
Admission: EM | Admit: 2016-10-05 | Discharge: 2016-10-05 | Disposition: A | Discharge status: Home / Self Care | Payer: Self-pay | Attending: Emergency Medicine | Admitting: Emergency Medicine

## 2016-10-05 ENCOUNTER — Emergency Department (HOSPITAL_COMMUNITY): Payer: Self-pay

## 2016-10-05 ENCOUNTER — Encounter (HOSPITAL_COMMUNITY): Payer: Self-pay

## 2016-10-05 DIAGNOSIS — M25511 Pain in right shoulder: Secondary | ICD-10-CM | POA: Insufficient documentation

## 2016-10-05 DIAGNOSIS — Y929 Unspecified place or not applicable: Secondary | ICD-10-CM | POA: Insufficient documentation

## 2016-10-05 DIAGNOSIS — X500XXA Overexertion from strenuous movement or load, initial encounter: Secondary | ICD-10-CM | POA: Insufficient documentation

## 2016-10-05 DIAGNOSIS — G8929 Other chronic pain: Secondary | ICD-10-CM | POA: Insufficient documentation

## 2016-10-05 DIAGNOSIS — Z79899 Other long term (current) drug therapy: Secondary | ICD-10-CM | POA: Insufficient documentation

## 2016-10-05 DIAGNOSIS — I10 Essential (primary) hypertension: Secondary | ICD-10-CM | POA: Insufficient documentation

## 2016-10-05 DIAGNOSIS — Y990 Civilian activity done for income or pay: Secondary | ICD-10-CM | POA: Insufficient documentation

## 2016-10-05 DIAGNOSIS — R0789 Other chest pain: Secondary | ICD-10-CM | POA: Insufficient documentation

## 2016-10-05 DIAGNOSIS — Y93F2 Activity, caregiving, lifting: Secondary | ICD-10-CM | POA: Insufficient documentation

## 2016-10-05 LAB — CBC
HCT: 43.8 % (ref 39.0–52.0)
Hemoglobin: 15.1 g/dL (ref 13.0–17.0)
MCH: 28.7 pg (ref 26.0–34.0)
MCHC: 34.5 g/dL (ref 30.0–36.0)
MCV: 83.3 fL (ref 78.0–100.0)
PLATELETS: 140 10*3/uL — AB (ref 150–400)
RBC: 5.26 MIL/uL (ref 4.22–5.81)
RDW: 13.5 % (ref 11.5–15.5)
WBC: 5.5 10*3/uL (ref 4.0–10.5)

## 2016-10-05 LAB — BASIC METABOLIC PANEL
Anion gap: 5 (ref 5–15)
BUN: 10 mg/dL (ref 6–20)
CHLORIDE: 106 mmol/L (ref 101–111)
CO2: 29 mmol/L (ref 22–32)
CREATININE: 0.83 mg/dL (ref 0.61–1.24)
Calcium: 9.3 mg/dL (ref 8.9–10.3)
GFR calc non Af Amer: >60 mL/min (ref >60)
Glucose, Bld: 91 mg/dL (ref 65–99)
Potassium: 4.1 mmol/L (ref 3.5–5.1)
Sodium: 140 mmol/L (ref 135–145)

## 2016-10-05 LAB — I-STAT TROPONIN, ED: Troponin i, poc: 0 ng/mL (ref 0.00–0.08)

## 2016-10-05 MED ORDER — METHOCARBAMOL 500 MG PO TABS
500.0000 mg | ORAL_TABLET | Freq: Once | ORAL | Status: AC
  Administered 2016-10-05: 500 mg via ORAL
  Filled 2016-10-05: qty 1

## 2016-10-05 MED ORDER — NAPROXEN 500 MG PO TABS
500.0000 mg | ORAL_TABLET | Freq: Once | ORAL | Status: AC
  Administered 2016-10-05: 500 mg via ORAL
  Filled 2016-10-05: qty 1

## 2016-10-05 MED ORDER — METHOCARBAMOL 500 MG PO TABS: 500.0000 mg | ORAL_TABLET | Freq: Two times a day (BID) | ORAL | 0 refills | Status: DC

## 2016-10-05 MED ORDER — NAPROXEN 500 MG PO TABS: 500.0000 mg | ORAL_TABLET | Freq: Two times a day (BID) | ORAL | 0 refills | Status: DC

## 2016-10-05 NOTE — ED Notes (Signed)
Called pt to go back to room. Pt not in lobby and was seen getting into a car. Assumed pt was leaving. Called next patient. Upon arriving back at triage, was told pt returned to the waiting room to finish treatment.

## 2016-10-05 NOTE — ED Triage Notes (Signed)
Pt presents with c/o chest pain in the right side of his chest that radiates to his right shoulder. Pt reports a hx of bursitis in that right shoulder as well. Pt in NAD, able to talk in complete sentences. Pt reports the pain in his chest is worse with movement.

## 2016-10-05 NOTE — Discharge Instructions (Signed)
Take the prescribed medication as directed. Follow-up with orthopedics-- Dr. Victorino DikeHewitt is on call but you may find your own if you'd like. Return to the ED for new or worsening symptoms.

## 2016-10-05 NOTE — ED Provider Notes (Signed)
[facility]-EMERGENCY DEPT Provider Note   CSN: [id number] Arrival date & time: 10/05/16  1257     History   Chief Complaint Chief Complaint  Patient presents with  . Chest Pain    HPI Mark Young is a 35 y.o. male.  The history is provided by the patient and medical records.  Chest Pain     35 year old male with history of hypertension, presenting to the ED for right-sided chest wall pain and right shoulder pain. He has a history of bursitis in the right shoulder and feels that is getting worse. 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 He does for some heavy lifting. He thinks he has strained the muscles of his right chest wall. States pain is worse with movement of the right shoulder, pushing, or pulling. He denies any shortness of breath, palpitations, dizziness, or weakness.  He has no known cardiac history. No significant family cardiac history. He reports he is an occasional smoker. No illicit drug use. He has not tried any medications prior to arrival.  Past Medical History:  Diagnosis Date  . Hypertension     There are no active problems to display for this patient.   History reviewed. No pertinent surgical history.     Home Medications    Prior to Admission medications   Medication Sig Start Date End Date Taking? Authorizing Provider  cyclobenzaprine (FLEXERIL) 10 MG tablet Take 10 mg by mouth 3 (three) times daily as needed for muscle spasms.   Yes Historical Provider, MD  Menthol, Topical Analgesic, (ICY HOT EX) Apply 1 application topically at bedtime as needed (pain).   Yes Historical Provider, MD  cyclobenzaprine (FLEXERIL) 5 MG tablet Take 1 tablet (5 mg total) by mouth 3 (three) times daily as needed for muscle spasms. Patient not taking: Reported on 10/05/2016 05/28/14   Linna HoffJames D Kindl, MD    Family History No family history on file.  Social History Social History  Substance Use Topics  . Smoking status: Never  Smoker  . Smokeless tobacco: Not on file  . Alcohol use Yes     Comment: occ     Allergies   Acetaminophen and Septra [sulfamethoxazole-trimethoprim]   Review of Systems Review of Systems  Cardiovascular: Positive for chest pain.  All other systems reviewed and are negative.    Physical Exam Updated Vital Signs BP 153/93   Pulse 71   Temp 97.9 F (36.6 C)   Resp 10   Ht 6\' 3"  (1.905 m)   Wt 99.8 kg   SpO2 97%   BMI 27.50 kg/m   Physical Exam  Constitutional: He is oriented to person, place, and time. He appears well-developed and well-nourished.  HENT:  Head: Normocephalic and atraumatic.  Mouth/Throat: Oropharynx is clear and moist.  Eyes: Conjunctivae and EOM are normal. Pupils are equal, round, and reactive to light.  Neck: Normal range of motion.  Cardiovascular: Normal rate, regular rhythm and normal heart sounds.   Pulmonary/Chest: Effort normal and breath sounds normal. No respiratory distress. He has no wheezes.  Right chest wall is tender to palpation, no noted deformities, pain increased when right pectoral muscles are engaged via pushing/pulling  Abdominal: Soft. Bowel sounds are normal.  Musculoskeletal: Normal range of motion.  Right shoulder normal in appearance without visible swelling or bony deformity, pain with range of motion, however is fully able to range shoulder in all directions, normal grip strength, normal sensation throughout right arm  Neurological:  He is alert and oriented to person, place, and time.  Skin: Skin is warm and dry.  Psychiatric: He has a normal mood and affect.  Nursing note and vitals reviewed.    ED Treatments / Results  Labs (all labs ordered are listed, but only abnormal results are displayed) Labs Reviewed  CBC - Abnormal; Notable for the following:       Result Value   Platelets 140 (*)    All other components within normal limits  BASIC METABOLIC PANEL  I-STAT TROPOININ, ED    EKG  EKG  Interpretation None       Radiology Dg Chest 2 View  Result Date: 10/05/2016 CLINICAL DATA:  Right-sided chest pain radiating to the right shoulder made worse with movement. Patient smokes occasionally. History of hypertension. EXAM: CHEST  2 VIEW COMPARISON:  PA and lateral chest x-ray of July 22, 2015 FINDINGS: The lungs are well-expanded. There is no focal infiltrate. Subtle increased density projecting over the anterior aspect of the right second rib has a configuration of a cardiac monitoring pad. No leads are present however. The heart and pulmonary vascularity are normal. The mediastinum is normal in width. There is no pleural effusion, pneumothorax, or pneumomediastinum. There is mild multilevel degenerative disc disease of the thoracic spine. The gas pattern in the upper abdomen is within the limits of normal. IMPRESSION: No definite acute cardiopulmonary abnormality. There is subtle density projecting in the right upper lobe that is likely artifactual and related to and a cardiac monitoring pad. If the patient's symptoms persist and remain unexplained, chest CT scanning would be a useful next imaging step. Electronically Signed   By: David  SwazilandJordan M.D.   On: 10/05/2016 14:04    Procedures Procedures (including critical care time)  Medications Ordered in ED Medications - No data to display   Initial Impression / Assessment and Plan / ED Course  I have reviewed the triage vital signs and the nursing notes.  Pertinent labs & imaging results that were available during my care of the patient were reviewed by me and considered in my medical decision making (see chart for details).  Clinical Course   35 year old male here with chest pain. States he feels muscle strain in his right chest and some right shoulder pain which is chronic. He has no shortness of breath or other associated symptoms. His EKG here is nonischemic. His lab work is reassuring. Chest x-ray is clear. Patient has  reproducible pain with palpation of the right chest wall, worsened by engaging his right pectoral muscles. At this time, feel that this is musculoskeletal chest wall pain. Do not clinically suspect ACS, PE, dissection, or other acute cardiac. Patient we discharged home with supportive care. He was given referral to orthopedics for his ongoing shoulder pain.  Discussed plan with patient, he acknowledged understanding and agreed with plan of care.  Return precautions given for new or worsening symptoms.  Final Clinical Impressions(s) / ED Diagnoses   Final diagnoses:  Chest wall pain  Acute pain of right shoulder    New Prescriptions New Prescriptions   METHOCARBAMOL (ROBAXIN) 500 MG TABLET    Take 1 tablet (500 mg total) by mouth 2 (two) times daily.   NAPROXEN (NAPROSYN) 500 MG TABLET    Take 1 tablet (500 mg total) by mouth 2 (two) times daily with a meal.     Garlon HatchetLisa M Serena Petterson, PA-C 10/05/16 1707    Jacalyn LefevreJulie Haviland, MD 10/05/16 [phone]

## 2016-10-10 ENCOUNTER — Emergency Department (HOSPITAL_COMMUNITY): Payer: Self-pay

## 2016-10-10 ENCOUNTER — Encounter (HOSPITAL_COMMUNITY): Payer: Self-pay | Admitting: *Deleted

## 2016-10-10 ENCOUNTER — Emergency Department (HOSPITAL_COMMUNITY)
Admission: EM | Admit: 2016-10-10 | Discharge: 2016-10-11 | Disposition: A | Discharge status: Home / Self Care | Payer: Self-pay | Attending: Emergency Medicine | Admitting: Emergency Medicine

## 2016-10-10 DIAGNOSIS — Z79899 Other long term (current) drug therapy: Secondary | ICD-10-CM | POA: Insufficient documentation

## 2016-10-10 DIAGNOSIS — R05 Cough: Secondary | ICD-10-CM

## 2016-10-10 DIAGNOSIS — R059 Cough, unspecified: Secondary | ICD-10-CM

## 2016-10-10 DIAGNOSIS — J069 Acute upper respiratory infection, unspecified: Secondary | ICD-10-CM | POA: Insufficient documentation

## 2016-10-10 DIAGNOSIS — I10 Essential (primary) hypertension: Secondary | ICD-10-CM | POA: Insufficient documentation

## 2016-10-10 NOTE — ED Triage Notes (Signed)
The pt has a cold and he is c/o his volice being hoarsed for one week  And he has been congested

## 2016-10-10 NOTE — ED Provider Notes (Signed)
[facility]-EMERGENCY DEPT Provider Note   CSN: [id number] Arrival date & time: 10/10/16  2224   By signing my name below, I, Clovis PuAvnee Patel, attest that this documentation has been prepared under the direction and in the presence of  [facility] Medical CenterJaime Mekiah Cambridge, PA-C. Electronically Signed: Clovis PuAvnee Patel, ED Scribe. 10/10/16. 11:05 PM.  History   Chief Complaint Chief Complaint  Patient presents with  . Sore Throat   The history is provided by the patient. No language interpreter was used.   HPI Comments:  Mark Young is a 35 y.o. male who presents to the Emergency Department complaining of hoarse voice x 1 day. Pt notes associated productive cough with dark yellow phlegm, throat discomfort, congestion, postnasal drip.  Pt has tried cough drops, alka seltzer and hot tea with little relief. He denies hemoptysis, ear pain, headaches, abdominal pain, bowel/bladder incontinence, dizziness, syncope, change in appetite, dysuria, urgency, fevers and chills. Denies cigarette smoking.  Past Medical History:  Diagnosis Date  . Hypertension     There are no active problems to display for this patient.   History reviewed. No pertinent surgical history.  Home Medications    Prior to Admission medications   Medication Sig Start Date End Date Taking? Authorizing Provider  cyclobenzaprine (FLEXERIL) 10 MG tablet Take 10 mg by mouth 3 (three) times daily as needed for muscle spasms.    Historical Provider, MD  cyclobenzaprine (FLEXERIL) 5 MG tablet Take 1 tablet (5 mg total) by mouth 3 (three) times daily as needed for muscle spasms. Patient not taking: Reported on 10/05/2016 05/28/14   Linna HoffJames D Kindl, MD  Menthol, Topical Analgesic, (ICY HOT EX) Apply 1 application topically at bedtime as needed (pain).    Historical Provider, MD  methocarbamol (ROBAXIN) 500 MG tablet Take 1 tablet (500 mg total) by mouth 2 (two) times daily. 10/05/16   Garlon HatchetLisa M Sanders, PA-C  naproxen (NAPROSYN) 500 MG tablet Take 1 tablet (500 mg  total) by mouth 2 (two) times daily with a meal. 10/05/16   Garlon HatchetLisa M Sanders, PA-C    Family History No family history on file.  Social History Social History  Substance Use Topics  . Smoking status: Never Smoker  . Smokeless tobacco: Never Used  . Alcohol use Yes     Comment: occ     Allergies   Acetaminophen and Septra [sulfamethoxazole-trimethoprim]   Review of Systems Review of Systems  Constitutional: Negative for chills and fever.  HENT: Positive for congestion, postnasal drip and sore throat. Negative for ear pain.   Respiratory: Positive for cough. Negative for shortness of breath.   Cardiovascular: Negative for chest pain.  Gastrointestinal: Negative for abdominal pain.  Genitourinary:       -bowel/bladder incontinence  Musculoskeletal: Positive for myalgias.  Neurological: Negative for dizziness, syncope and headaches.   Physical Exam Updated Vital Signs BP 151/83 (BP Location: Left Leg)   Pulse 73   Temp 98.5 F (36.9 C) (Oral)   Resp 18   Ht 6\' 3"  (1.905 m)   Wt 93.9 kg   SpO2 100%   BMI 25.87 kg/m   Physical Exam  Constitutional: He is oriented to person, place, and time. He appears well-developed and well-nourished. No distress.  HENT:  Head: Normocephalic and atraumatic.  OP with erythema, no exudates or tonsillar hypertrophy. + post nasal drip  Neck: Normal range of motion. Neck supple.  No meningeal signs.   Cardiovascular: Normal rate, regular rhythm and normal heart sounds.   Pulmonary/Chest: Effort normal.  Lungs  are clear to auscultation bilaterally - no w/r/r  Abdominal: Soft. He exhibits no distension. There is no tenderness.  Musculoskeletal: Normal range of motion.  Neurological: He is alert and oriented to person, place, and time.  Skin: Skin is warm and dry. He is not diaphoretic.  Nursing note and vitals reviewed.  ED Treatments / Results  DIAGNOSTIC STUDIES:  Oxygen Saturation is 92% on RA, low by my interpretation.     COORDINATION OF CARE:  11:04 PM Discussed treatment plan with pt at bedside and pt agreed to plan.  Labs (all labs ordered are listed, but only abnormal results are displayed) Labs Reviewed - No data to display  EKG  EKG Interpretation None       Radiology Dg Chest 2 View  Result Date: 10/10/2016 CLINICAL DATA:  Cold and hoarseness for 1 week.  Congestion. EXAM: CHEST  2 VIEW COMPARISON:  10/05/2016 FINDINGS: Hyperinflation suggesting asthma or emphysematous change. Normal heart size and pulmonary vascularity. No focal airspace disease or consolidation in the lungs. No blunting of costophrenic angles. No pneumothorax. Mediastinal contours appear intact. Mild degenerative changes in the spine. No significant changes since previous study. IMPRESSION: Hyperinflation in the lungs. No evidence of active pulmonary disease. Electronically Signed   By: Burman NievesWilliam  Stevens M.D.   On: 10/10/2016 23:39    Procedures Procedures (including critical care time)  Medications Ordered in ED Medications - No data to display   Initial Impression / Assessment and Plan / ED Course  I have reviewed the triage vital signs and the nursing notes.  Pertinent labs & imaging results that were available during my care of the patient were reviewed by me and considered in my medical decision making (see chart for details).  Clinical Course    Mark Young is afebrile, non-toxic appearing with a clear lung exam. Mild rhinorrhea and OP with erythema, no exudates. Likely viral URI. Patient is agreeable to symptomatic treatment with close follow up with PCP as needed but spoke at length about emergent, changing, or worsening of symptoms that should prompt return to ER. Patient voices understanding and is agreeable to plan.   Blood pressure 151/83, pulse 73, temperature 98.5 F (36.9 C), temperature source Oral, resp. rate 18, height 6\' 3"  (1.905 m), weight 93.9 kg, SpO2 100 %.   Final Clinical  Impressions(s) / ED Diagnoses   Final diagnoses:  Cough  Upper respiratory tract infection, unspecified type    New Prescriptions Discharge Medication List as of 10/11/2016 12:02 AM     I personally performed the services described in this documentation, which was scribed in my presence. The recorded information has been reviewed and is accurate.     [facility] HospitalJaime Pilcher Johnell Landowski, PA-C 10/11/16 [phone]    Alvira MondayErin Schlossman, MD 10/11/16 1243

## 2016-10-10 NOTE — ED Notes (Signed)
Patient transported to X-ray 

## 2016-10-10 NOTE — ED Notes (Signed)
Pt. Returned from xray 

## 2016-10-11 NOTE — Discharge Instructions (Signed)
1. Medications: flonase and mucinex for nasal congestion,  continue usual home medications 2. Treatment: rest, drink plenty of fluids 3. Follow Up: Please follow up with your primary doctor for discussion of your diagnoses and further evaluation after today's visit if symptoms persist longer than 10-14 days; Return to the ER for high fevers, difficulty breathing or other concerning symptoms

## 2016-11-15 ENCOUNTER — Encounter (HOSPITAL_COMMUNITY): Payer: Self-pay | Admitting: *Deleted

## 2016-11-15 ENCOUNTER — Emergency Department (HOSPITAL_COMMUNITY)
Admission: EM | Admit: 2016-11-15 | Discharge: 2016-11-15 | Disposition: A | Discharge status: Home / Self Care | Payer: Self-pay | Attending: Emergency Medicine | Admitting: Emergency Medicine

## 2016-11-15 DIAGNOSIS — I10 Essential (primary) hypertension: Secondary | ICD-10-CM | POA: Insufficient documentation

## 2016-11-15 DIAGNOSIS — R21 Rash and other nonspecific skin eruption: Secondary | ICD-10-CM | POA: Insufficient documentation

## 2016-11-15 LAB — URINALYSIS, ROUTINE W REFLEX MICROSCOPIC
Bilirubin Urine: NEGATIVE
GLUCOSE, UA: NEGATIVE mg/dL
HGB URINE DIPSTICK: NEGATIVE
Ketones, ur: NEGATIVE mg/dL
Leukocytes, UA: NEGATIVE
Nitrite: NEGATIVE
Protein, ur: NEGATIVE mg/dL
SPECIFIC GRAVITY, URINE: 1.013 (ref 1.005–1.030)
pH: 8 (ref 5.0–8.0)

## 2016-11-15 MED ORDER — KETOCONAZOLE 2 % EX CREA: 1.0000 "application " | TOPICAL_CREAM | Freq: Two times a day (BID) | CUTANEOUS | 0 refills | Status: DC

## 2016-11-15 NOTE — ED Provider Notes (Signed)
[facility]-EMERGENCY DEPT Provider Note   CSN: [id number] Arrival date & time: 11/15/16  [id number]  By signing my name below, I, Javier Dockerobert Ryan Halas, attest that this documentation has been prepared under the direction and in the presence of Terance HartKelly Nanea Jared, PA-C. Electronically Signed: Javier Dockerobert Ryan Halas, ER Scribe. 07/17/2016. 10:37 AM.  History   Chief Complaint Chief Complaint  Patient presents with  . Rash   HPI  HPI Comments: Mark Young is a 35 y.o. male who presents to the Emergency Department complaining of an itchy dry rash in his groin for two months. The rash is mildly painful but is more irritating than painful. He has used lotrimin lotion without relief. He denies penile discharge or testicular pain. No fever, chills.   Past Medical History:  Diagnosis Date  . Hypertension     There are no active problems to display for this patient.   History reviewed. No pertinent surgical history.   Home Medications    Prior to Admission medications   Medication Sig Start Date End Date Taking? Authorizing Provider  cyclobenzaprine (FLEXERIL) 10 MG tablet Take 10 mg by mouth 3 (three) times daily as needed for muscle spasms.    Historical Provider, MD  cyclobenzaprine (FLEXERIL) 5 MG tablet Take 1 tablet (5 mg total) by mouth 3 (three) times daily as needed for muscle spasms. Patient not taking: Reported on 10/05/2016 05/28/14   Linna HoffJames D Kindl, MD  Menthol, Topical Analgesic, (ICY HOT EX) Apply 1 application topically at bedtime as needed (pain).    Historical Provider, MD  methocarbamol (ROBAXIN) 500 MG tablet Take 1 tablet (500 mg total) by mouth 2 (two) times daily. 10/05/16   Garlon HatchetLisa M Sanders, PA-C  naproxen (NAPROSYN) 500 MG tablet Take 1 tablet (500 mg total) by mouth 2 (two) times daily with a meal. 10/05/16   Garlon HatchetLisa M Sanders, PA-C    Family History No family history on file.  Social History Social History  Substance Use Topics  . Smoking status: Never Smoker  . Smokeless tobacco:  Never Used  . Alcohol use Yes     Comment: occ     Allergies   Acetaminophen and Septra [sulfamethoxazole-trimethoprim]   Review of Systems Review of Systems  Constitutional: Negative for chills and fever.  Genitourinary: Positive for genital sores. Negative for discharge, dysuria, penile pain and testicular pain.  Skin: Positive for rash and wound.    Physical Exam Updated Vital Signs BP 144/85 (BP Location: Left Arm)   Temp 98.2 F (36.8 C) (Oral)   Resp 18   Ht 6\' 2"  (1.88 m)   Wt 210 lb (95.3 kg)   SpO2 98%   BMI 26.96 kg/m   Physical Exam  Constitutional: He is oriented to person, place, and time. He appears well-developed and well-nourished. No distress.  HENT:  Head: Normocephalic and atraumatic.  Eyes: Pupils are equal, round, and reactive to light.  Neck: Neck supple.  Cardiovascular: Normal rate.   Pulmonary/Chest: Effort normal. No respiratory distress.  Genitourinary:  Genitourinary Comments: No inguinal lymphadenopathy or inguinal hernia noted. Normal circumcised penis. No obvious discharge noted. Testicles are nontender with normal lie. Scrotum has several ulcerations which are non-tender. No erythema or scaling in groin seen.  Chaperone [employer](Ryan, scribe) present during exam.    Musculoskeletal: Normal range of motion.  Neurological: He is alert and oriented to person, place, and time. Coordination normal.  Skin: Skin is warm and dry. He is not diaphoretic.  Psychiatric: He has a normal mood and  affect. His behavior is normal.  Nursing note and vitals reviewed.    ED Treatments / Results  Labs (all labs ordered are listed, but only abnormal results are displayed) Labs Reviewed  URINALYSIS, ROUTINE W REFLEX MICROSCOPIC  RPR  HIV ANTIBODY (ROUTINE TESTING)  HSV 2 ANTIBODY, IGG  GC/CHLAMYDIA PROBE AMP ([place]) NOT AT [facility]    EKG  EKG Interpretation None    [facility]   Radiology No results found.  Procedures Procedures (including critical care  time)  Medications Ordered in ED Medications - No data to display   Initial Impression / Assessment and Plan / ED Course  I have reviewed the triage vital signs and the nursing notes.  Pertinent labs & imaging results that were available during my care of the patient were reviewed by me and considered in my medical decision making (see chart for details).  Clinical Course    35 year old male presents with rash and ulcerations. Some concern for STD since patient has been treated with Lotrimin with no relief. Will send STI panel. Rx for Ketoconazole given. Advised to keep area clean and dry as possible. Return precautions given.  Final Clinical Impressions(s) / ED Diagnoses   Final diagnoses:  Rash    New Prescriptions Discharge Medication List as of 11/15/2016 11:14 AM    START taking these medications   Details  ketoconazole (NIZORAL) 2 % cream Apply 1 application topically 2 (two) times daily. Use for the next 4 weeks, Starting Mon 11/15/2016, Print         I personally performed the services described in this documentation, which was scribed in my presence. The recorded information has been reviewed and is accurate.       Bethel BornKelly Marie Kaleel Schmieder, PA-C 11/15/16 1132    Geoffery Lyonsouglas Delo, MD 11/16/16 [phone]

## 2016-11-15 NOTE — ED Triage Notes (Signed)
Pt c/o rash between testicles and thigh.

## 2016-11-15 NOTE — Discharge Instructions (Signed)
Keep area clean and dry Use cream twice daily for the next 4 weeks Return if symptoms are worsening

## 2016-11-15 NOTE — ED Notes (Signed)
Declined W/C at D/C and was escorted to lobby by RN. 

## 2016-11-16 LAB — HSV 2 ANTIBODY, IGG: HSV 2 Glycoprotein G Ab, IgG: <0.91 index (ref 0.00–0.90)

## 2016-11-16 LAB — HIV ANTIBODY (ROUTINE TESTING W REFLEX): HIV SCREEN 4TH GENERATION: NONREACTIVE

## 2016-11-16 LAB — GC/CHLAMYDIA PROBE AMP (~~LOC~~) NOT AT ARMC
Chlamydia: NEGATIVE
Neisseria Gonorrhea: NEGATIVE

## 2016-11-16 LAB — RPR: RPR: NONREACTIVE

## 2017-05-31 ENCOUNTER — Encounter (HOSPITAL_COMMUNITY): Payer: Self-pay | Admitting: *Deleted

## 2017-05-31 ENCOUNTER — Emergency Department (HOSPITAL_COMMUNITY)
Admission: EM | Admit: 2017-05-31 | Discharge: 2017-05-31 | Disposition: A | Discharge status: Home / Self Care | Payer: Self-pay | Attending: Emergency Medicine | Admitting: Emergency Medicine

## 2017-05-31 DIAGNOSIS — Z79899 Other long term (current) drug therapy: Secondary | ICD-10-CM | POA: Insufficient documentation

## 2017-05-31 DIAGNOSIS — M545 Low back pain, unspecified: Secondary | ICD-10-CM

## 2017-05-31 DIAGNOSIS — Z202 Contact with and (suspected) exposure to infections with a predominantly sexual mode of transmission: Secondary | ICD-10-CM | POA: Insufficient documentation

## 2017-05-31 DIAGNOSIS — I10 Essential (primary) hypertension: Secondary | ICD-10-CM | POA: Insufficient documentation

## 2017-05-31 MED ORDER — DICLOFENAC SODIUM 1 % TD GEL: 4.0000 g | Freq: Four times a day (QID) | TRANSDERMAL | 0 refills | Status: DC

## 2017-05-31 NOTE — ED Triage Notes (Signed)
Pt complains of mid lower back back pain since bending over to change his tire last Friday. Pt states the pain has gotten better but has not gone away completely.   Pt would also like HIV test. Pt states his girlfriend found out her ex-boyfriend had HIV recently. Pt has been having unprotected intercourse with girlfriend for the last 3 months.

## 2017-05-31 NOTE — ED Provider Notes (Signed)
[facility]-EMERGENCY DEPT Provider Note   CSN: [id number] Arrival date & time: 05/31/17  1527  By signing my name below, I, Freida Busman, attest that this documentation has been prepared under the direction and in the presence of [employer] [facility], PA-C. Electronically Signed: Freida Busman, Scribe. 05/31/2017. 6:23 PM.  History   Chief Complaint Chief Complaint  Patient presents with  . Back Pain  . Exposure to STD    The history is provided by the patient. No language interpreter was used.     HPI Comments:  Mark Young is a 36 y.o. male who presents to the Emergency Department complaining of gradual onset, unchanged, low back pain x 5 days. His pain began after changing a tire. Patient denies upper back or neck pain. No fever, saddle anesthesia, weakness, numbness, urinary complaints including retention/incontinence. No history of cancer, IVDU, or recent spinal procedures. No alleviating factors noted; no treatments tried PTA.   Pt is also requesting STD testing. He states his girlfriend's ex-boyfriend was recently diagnosed with HIV and he has had unprotected intercourse with her over the last 3 months. No recent URI's or illnesses. Feels fine other than back pain. No penile discharge or testicular/scrotal pain or swelling.  Past Medical History:  Diagnosis Date  . Hypertension     There are no active problems to display for this patient.   History reviewed. No pertinent surgical history.     Home Medications    Prior to Admission medications   Medication Sig Start Date End Date Taking? Authorizing Provider  cyclobenzaprine (FLEXERIL) 10 MG tablet Take 10 mg by mouth 3 (three) times daily as needed for muscle spasms.    [provider]  diclofenac sodium (VOLTAREN) 1 % GEL Apply 4 g topically 4 (four) times daily. 05/31/17   Ronell Duffus, Chase Picket, PA-C  ketoconazole (NIZORAL) 2 % cream Apply 1 application topically 2 (two) times daily. Use for the next 4 weeks 11/15/16    Bethel Born, PA-C  Menthol, Topical Analgesic, (ICY HOT EX) Apply 1 application topically at bedtime as needed (pain).    [provider]  methocarbamol (ROBAXIN) 500 MG tablet Take 1 tablet (500 mg total) by mouth 2 (two) times daily. 10/05/16   Garlon Hatchet, PA-C  naproxen (NAPROSYN) 500 MG tablet Take 1 tablet (500 mg total) by mouth 2 (two) times daily with a meal. 10/05/16   Garlon Hatchet, PA-C    Family History No family history on file.  Social History Social History  Substance Use Topics  . Smoking status: Never Smoker  . Smokeless tobacco: Never Used  . Alcohol use Yes     Comment: occ     Allergies   Acetaminophen and Septra [sulfamethoxazole-trimethoprim]   Review of Systems Review of Systems  Constitutional: Negative for chills and fever.  Respiratory: Negative for shortness of breath.   Cardiovascular: Negative for chest pain.  Genitourinary: Negative for discharge, dysuria, penile pain, penile swelling, scrotal swelling and testicular pain.  Musculoskeletal: Positive for back pain.  Neurological: Negative for weakness and numbness.     Physical Exam Updated Vital Signs BP (!) 145/95 (BP Location: Right Arm)   Pulse 61   Temp 98.8 F (37.1 C) (Oral)   Resp 18   SpO2 100%   Physical Exam  Constitutional: He is oriented to person, place, and time. He appears well-developed and well-nourished.  Neck:  No midline or paraspinal tenderness. Full ROM without pain.  Cardiovascular: Normal rate, regular rhythm, normal heart  sounds and intact distal pulses.   Pulmonary/Chest: Effort normal and breath sounds normal. No respiratory distress.  Abdominal: Soft. Bowel sounds are normal. He exhibits no distension. There is no tenderness.  Genitourinary:  Genitourinary Comments: Chaperone present for exam. No discharge from penis. No signs of lesion or erythema on the penis or testicles. The penis and testicles are nontender. No testicular masses or  swelling. No signs of any inguinal hernias.  Musculoskeletal:  No midline T/L tenderness. Tenderness to palpation of right lumbar paraspinal musculature. 5/5 muscle strength in bilateral LE's. Straight leg raises are negative bilaterally for radicular symptoms. Able to ambulate independently with steady gait.  Neurological: He is alert and oriented to person, place, and time. He has normal reflexes.  Bilateral lower extremities neurovascularly intact.  Skin: Skin is warm and dry. No rash noted. No erythema.  Nursing note and vitals reviewed.    ED Treatments / Results  DIAGNOSTIC STUDIES:  Oxygen Saturation is 100% on RA, normal by my interpretation.    COORDINATION OF CARE:  6:23 PM Discussed treatment plan with pt at bedside and pt agreed to plan.  Labs (all labs ordered are listed, but only abnormal results are displayed) Labs Reviewed  HIV ANTIBODY (ROUTINE TESTING)  RPR    EKG  EKG Interpretation None       Radiology No results found.  Procedures Procedures (including critical care time)  Medications Ordered in ED Medications - No data to display   Initial Impression / Assessment and Plan / ED Course  I have reviewed the triage vital signs and the nursing notes.  Pertinent labs & imaging results that were available during my care of the patient were reviewed by me and considered in my medical decision making (see chart for details).    Mark Young is a 36 y.o. male who presents to ED for two concerns:  1.  low back pain c/w muscle strain. Patient demonstrates no lower extremity weakness, saddle anesthesia, bowel or bladder incontinence or neuro deficits. No concern for cauda equina. No fevers or other infectious symptoms to suggest that the patient's back pain is due to an infection. Lower extremities are neurovascularly intact and patient is ambulating without difficulty. I have reviewed return precautions, including the development of any of these signs  or symptoms and the patient has voiced understanding. Patient does not want to take pills for pain. Discussed option of voltaren gel and patient agrees this is much better fit as treatment plan for him. PCP follow-up if symptoms do not improve.  2. Possible HIV exposure. Asymptomatic. His girlfriend's ex-boyfriend was diagnosed with HIV recently, therefore he would like HIV testing. HIV and RPR obtained. Patient aware he will be notified if results are positive.  Final Clinical Impressions(s) / ED Diagnoses   Final diagnoses:  Acute bilateral low back pain without sciatica  Potential exposure to STD    New Prescriptions Discharge Medication List as of 05/31/2017  5:07 PM    START taking these medications   Details  diclofenac sodium (VOLTAREN) 1 % GEL Apply 4 g topically 4 (four) times daily., Starting Tue 05/31/2017, Print       I personally performed the services described in this documentation, which was scribed in my presence. The recorded information has been reviewed and is accurate.     Ottilia Pippenger, Chase PicketJaime Pilcher, PA-C 05/31/17 Vashti [id number]    Knapp, Jon, MD 05/31/17 [phone]

## 2017-05-31 NOTE — Discharge Instructions (Signed)
Voltaren gel to back as needed for pain. Ice affected area for additional pain relief. Follow up with PCP if symptoms persist.   You have been tested for HIV, syphilis. These results will be available in approximately 3 days. You will be notified if they are positive.   Return to ER for new or worsening symptoms, any additional concerns.

## 2017-06-01 LAB — HIV ANTIBODY (ROUTINE TESTING W REFLEX): HIV SCREEN 4TH GENERATION: NONREACTIVE

## 2017-06-01 LAB — RPR: RPR Ser Ql: NONREACTIVE

## 2018-01-18 ENCOUNTER — Encounter (HOSPITAL_COMMUNITY): Payer: Self-pay

## 2018-01-18 ENCOUNTER — Emergency Department (HOSPITAL_COMMUNITY)
Admission: EM | Admit: 2018-01-18 | Discharge: 2018-01-18 | Disposition: A | Discharge status: Home / Self Care | Payer: Self-pay | Attending: Emergency Medicine | Admitting: Emergency Medicine

## 2018-01-18 DIAGNOSIS — M436 Torticollis: Secondary | ICD-10-CM | POA: Insufficient documentation

## 2018-01-18 DIAGNOSIS — I10 Essential (primary) hypertension: Secondary | ICD-10-CM | POA: Insufficient documentation

## 2018-01-18 MED ORDER — IBUPROFEN 600 MG PO TABS: 600.0000 mg | ORAL_TABLET | Freq: Four times a day (QID) | ORAL | 0 refills | Status: DC | PRN

## 2018-01-18 MED ORDER — CYCLOBENZAPRINE HCL 10 MG PO TABS: 10.0000 mg | ORAL_TABLET | Freq: Two times a day (BID) | ORAL | 0 refills | Status: DC | PRN

## 2018-01-18 MED ORDER — PREDNISONE 10 MG (21) PO TBPK: ORAL_TABLET | ORAL | 0 refills | Status: DC

## 2018-01-18 MED ORDER — KETOROLAC TROMETHAMINE 30 MG/ML IJ SOLN
30.0000 mg | Freq: Once | INTRAMUSCULAR | Status: AC
  Administered 2018-01-18: 30 mg via INTRAMUSCULAR
  Filled 2018-01-18: qty 1

## 2018-01-18 MED ORDER — DEXAMETHASONE SODIUM PHOSPHATE 10 MG/ML IJ SOLN
10.0000 mg | Freq: Once | INTRAMUSCULAR | Status: AC
Start: 2018-01-18 — End: 2018-01-18
  Administered 2018-01-18: 10 mg via INTRAMUSCULAR
  Filled 2018-01-18: qty 1

## 2018-01-18 MED ORDER — HYDROCODONE-ACETAMINOPHEN 5-325 MG PO TABS: 1.0000 | ORAL_TABLET | ORAL | 0 refills | Status: DC | PRN

## 2018-01-18 NOTE — ED Provider Notes (Signed)
[facility] Provider Note   CSN: [id number] Arrival date & time: 01/18/18  [id number]     History   Chief Complaint Chief Complaint  Patient presents with  . Neck Pain    HPI Mark Young is a 37 y.o. male.  Pt presents to the ED today with left sided neck pain.  Pt said he woke up with these sx 2 days ago.  He has tried ibuprofen and robaxin without improvement in sx.  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  This morning, pain is worse.  No numbness in arms.  No h/a.  No trauma.      Past Medical History:  Diagnosis Date  . Hypertension     There are no active problems to display for this patient.   History reviewed. No pertinent surgical history.     Home Medications    Prior to Admission medications   Medication Sig Start Date End Date Taking? Authorizing Provider  methocarbamol (ROBAXIN) 500 MG tablet Take 1 tablet (500 mg total) by mouth 2 (two) times daily. 10/05/16  Yes Garlon HatchetSanders, Lisa M, PA-C  cyclobenzaprine (FLEXERIL) 10 MG tablet Take 1 tablet (10 mg total) by mouth 2 (two) times daily as needed for muscle spasms. 01/18/18   Jacalyn LefevreHaviland, Minnetta Sandora, MD  HYDROcodone-acetaminophen (NORCO/VICODIN) 5-325 MG tablet Take 1 tablet by mouth every 4 (four) hours as needed. 01/18/18   Jacalyn LefevreHaviland, Temica Righetti, MD  ibuprofen (ADVIL,MOTRIN) 600 MG tablet Take 1 tablet (600 mg total) by mouth every 6 (six) hours as needed. 01/18/18   Jacalyn LefevreHaviland, Blu Lori, MD  predniSONE (STERAPRED UNI-PAK 21 TAB) 10 MG (21) TBPK tablet Take 6 tabs by mouth daily  for 2 days, then 5 tabs for 2 days, then 4 tabs for 2 days, then 3 tabs for 2 days, 2 tabs for 2 days, then 1 tab by mouth daily for 2 days 01/18/18   Jacalyn LefevreHaviland, Karess Harner, MD    Family History History reviewed. No pertinent family history.  Social History Social History   Tobacco Use  . Smoking status: Never Smoker  . Smokeless tobacco: Never Used  Substance Use Topics  . Alcohol  use: Yes    Comment: occ  . Drug use: Yes    Types: Marijuana     Allergies   Acetaminophen and Septra [sulfamethoxazole-trimethoprim]   Review of Systems Review of Systems  Musculoskeletal: Positive for neck pain.  All other systems reviewed and are negative.    Physical Exam Updated Vital Signs BP (!) 152/109 (BP Location: Right Arm)   Pulse 69   Temp 98.1 F (36.7 C) (Oral)   Resp 18   SpO2 96%   Physical Exam  Constitutional: He is oriented to person, place, and time. He appears well-developed and well-nourished.  HENT:  Head: Normocephalic and atraumatic.  Right Ear: External ear normal.  Left Ear: External ear normal.  Nose: Nose normal.  Mouth/Throat: Oropharynx is clear and moist.  Eyes: Conjunctivae and EOM are normal. Pupils are equal, round, and reactive to light.  Neck: Muscular tenderness present. No spinous process tenderness present.  Left sided cervical muscle tenderness and spasm  Cardiovascular: Normal rate, regular rhythm, normal heart sounds and intact distal pulses.  Pulmonary/Chest: Effort normal and breath sounds normal.  Abdominal: Soft. Bowel sounds are normal.  Musculoskeletal: Normal range of motion.  Neurological: He is alert and oriented to person, place, and time.  Skin: Skin is warm. Capillary refill takes less  than 2 seconds.  Psychiatric: He has a normal mood and affect. His behavior is normal. Thought content normal.  Nursing note and vitals reviewed.    ED Treatments / Results  Labs (all labs ordered are listed, but only abnormal results are displayed) Labs Reviewed - No data to display  EKG  EKG Interpretation None       Radiology No results found.  Procedures Procedures (including critical care time)  Medications Ordered in ED Medications  dexamethasone (DECADRON) injection 10 mg (not administered)  ketorolac (TORADOL) 30 MG/ML injection 30 mg (not administered)     Initial Impression / Assessment and Plan  / ED Course  I have reviewed the triage vital signs and the nursing notes.  Pertinent labs & imaging results that were available during my care of the patient were reviewed by me and considered in my medical decision making (see chart for details).    Pt will be given decadron and toradol prior to d/c.  He knows to return if worse and to f/u with community health and wellness.  Final Clinical Impressions(s) / ED Diagnoses   Final diagnoses:  Torticollis, acute    ED Discharge Orders        Ordered    predniSONE (STERAPRED UNI-PAK 21 TAB) 10 MG (21) TBPK tablet     01/18/18 0808    cyclobenzaprine (FLEXERIL) 10 MG tablet  2 times daily PRN     01/18/18 0808    ibuprofen (ADVIL,MOTRIN) 600 MG tablet  Every 6 hours PRN     01/18/18 0808    HYDROcodone-acetaminophen (NORCO/VICODIN) 5-325 MG tablet  Every 4 hours PRN     01/18/18 4098       Jacalyn Lefevre, MD 01/18/18 [phone]

## 2018-01-18 NOTE — ED Triage Notes (Signed)
Pt states that he woke up two days ago and he had a pain behind his left ear radiating down in to his shoulder blade He took ibuprofen and a muscle relaxer and used icy hot last night at work and he felt a little better but the pain was back this am

## 2018-09-24 ENCOUNTER — Other Ambulatory Visit: Payer: Self-pay

## 2018-09-24 ENCOUNTER — Emergency Department (HOSPITAL_COMMUNITY): Payer: Self-pay

## 2018-09-24 ENCOUNTER — Emergency Department (HOSPITAL_COMMUNITY)
Admission: EM | Admit: 2018-09-24 | Discharge: 2018-09-24 | Disposition: A | Discharge status: Home / Self Care | Payer: Self-pay | Attending: Emergency Medicine | Admitting: Emergency Medicine

## 2018-09-24 ENCOUNTER — Encounter (HOSPITAL_COMMUNITY): Payer: Self-pay | Admitting: *Deleted

## 2018-09-24 DIAGNOSIS — W108XXA Fall (on) (from) other stairs and steps, initial encounter: Secondary | ICD-10-CM | POA: Insufficient documentation

## 2018-09-24 DIAGNOSIS — I10 Essential (primary) hypertension: Secondary | ICD-10-CM | POA: Insufficient documentation

## 2018-09-24 DIAGNOSIS — S20229A Contusion of unspecified back wall of thorax, initial encounter: Secondary | ICD-10-CM | POA: Insufficient documentation

## 2018-09-24 DIAGNOSIS — Y939 Activity, unspecified: Secondary | ICD-10-CM | POA: Insufficient documentation

## 2018-09-24 DIAGNOSIS — Z79899 Other long term (current) drug therapy: Secondary | ICD-10-CM | POA: Insufficient documentation

## 2018-09-24 DIAGNOSIS — Y999 Unspecified external cause status: Secondary | ICD-10-CM | POA: Insufficient documentation

## 2018-09-24 DIAGNOSIS — Y929 Unspecified place or not applicable: Secondary | ICD-10-CM | POA: Insufficient documentation

## 2018-09-24 MED ORDER — METHOCARBAMOL 500 MG PO TABS: 500.0000 mg | ORAL_TABLET | Freq: Four times a day (QID) | ORAL | 0 refills | Status: DC | PRN

## 2018-09-24 MED ORDER — METHOCARBAMOL 1000 MG/10ML IJ SOLN
1000.0000 mg | Freq: Once | INTRAVENOUS | Status: AC
  Administered 2018-09-24: 1000 mg via INTRAVENOUS
  Filled 2018-09-24: qty 10

## 2018-09-24 MED ORDER — OXYCODONE HCL 5 MG PO TABS
5.0000 mg | ORAL_TABLET | Freq: Once | ORAL | Status: AC
  Administered 2018-09-24: 5 mg via ORAL
  Filled 2018-09-24: qty 1

## 2018-09-24 MED ORDER — MORPHINE SULFATE (PF) 4 MG/ML IV SOLN
4.0000 mg | Freq: Once | INTRAVENOUS | Status: AC
  Administered 2018-09-24: 4 mg via INTRAVENOUS
  Filled 2018-09-24: qty 1

## 2018-09-24 MED ORDER — KETOROLAC TROMETHAMINE 30 MG/ML IJ SOLN
30.0000 mg | Freq: Once | INTRAMUSCULAR | Status: AC
  Administered 2018-09-24: 30 mg via INTRAVENOUS
  Filled 2018-09-24: qty 1

## 2018-09-24 MED ORDER — OXYCODONE HCL 5 MG PO TABS: 5.0000 mg | ORAL_TABLET | ORAL | 0 refills | Status: DC | PRN

## 2018-09-24 NOTE — ED Provider Notes (Signed)
[facility]-EMERGENCY DEPT Provider Note   CSN: [id number] Arrival date & time: 09/24/18  0247     History   Chief Complaint Chief Complaint  Patient presents with  . Fall    HPI Mark Young is a 37 y.o. male.  The history is provided by the patient.  He has a history of hypertension, and comes in after slipping on some metal steps and falling down about 9 steps.  He initially thought that injury was not severe, and went home.  However, pain has gotten progressively worse.  He currently rates pain at 10/10.  Is complaining of pain in his upper back, lower back, left wrist, left shoulder.  He denies loss of consciousness and denies any head injury.  He has not taken anything for pain.  Past Medical History:  Diagnosis Date  . Hypertension     There are no active problems to display for this patient.   History reviewed. No pertinent surgical history.      Home Medications    Prior to Admission medications   Medication Sig Start Date End Date Taking? Authorizing Provider  cyclobenzaprine (FLEXERIL) 10 MG tablet Take 1 tablet (10 mg total) by mouth 2 (two) times daily as needed for muscle spasms. 01/18/18   Jacalyn Lefevre, MD  HYDROcodone-acetaminophen (NORCO/VICODIN) 5-325 MG tablet Take 1 tablet by mouth every 4 (four) hours as needed. 01/18/18   Jacalyn Lefevre, MD  ibuprofen (ADVIL,MOTRIN) 600 MG tablet Take 1 tablet (600 mg total) by mouth every 6 (six) hours as needed. 01/18/18   Jacalyn Lefevre, MD  methocarbamol (ROBAXIN) 500 MG tablet Take 1 tablet (500 mg total) by mouth 2 (two) times daily. 10/05/16   Garlon Hatchet, PA-C  predniSONE (STERAPRED UNI-PAK 21 TAB) 10 MG (21) TBPK tablet Take 6 tabs by mouth daily  for 2 days, then 5 tabs for 2 days, then 4 tabs for 2 days, then 3 tabs for 2 days, 2 tabs for 2 days, then 1 tab by mouth daily for 2 days 01/18/18   Jacalyn Lefevre, MD    Family History No family history on file.  Social  History Social History   Tobacco Use  . Smoking status: Never Smoker  . Smokeless tobacco: Never Used  Substance Use Topics  . Alcohol use: Yes    Comment: occ  . Drug use: Yes    Types: Marijuana     Allergies   Acetaminophen and Septra [sulfamethoxazole-trimethoprim]   Review of Systems Review of Systems  All other systems reviewed and are negative.    Physical Exam Updated Vital Signs BP (!) 158/105   Pulse 79   Temp 98.2 F (36.8 C) (Oral)   Resp 20   Ht 6\' 3"  (1.905 m)   Wt 102.1 kg   SpO2 98%   BMI 28.12 kg/m   Physical Exam  Nursing note and vitals reviewed.  37 year old male, appears to be in pain, but is in no acute distress. Vital signs are significant for elevated blood pressure. Oxygen saturation is 98%, which is normal. Head is normocephalic and atraumatic. PERRLA, EOMI. Oropharynx is clear. Neck is nontender and supple without adenopathy or JVD. Back is tender diffusely without any point tenderness.  There is no CVA tenderness. Lungs are clear without rales, wheezes, or rhonchi. Chest is nontender. Heart has regular rate and rhythm without murmur. Abdomen is soft, flat, nontender without masses or hepatosplenomegaly and peristalsis is normoactive. Extremities: No swelling or deformity.  There  is mild to moderate tenderness to palpation rather diffusely through the left shoulder.  There is moderate pain on passive range of motion.  There is no swelling or deformity of the left wrist.  There is tenderness to palpation over the base of the thenar eminence.  No anatomic snuffbox tenderness.  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  No pain with axial compression of the thumb.  No other extremity injuries seen. Skin is warm and dry without rash. Neurologic: Mental status is normal, cranial nerves are intact, there are no motor or sensory deficits.  ED Treatments / Results   Radiology Dg Thoracic Spine W/swimmers  Result  Date: 09/24/2018 CLINICAL DATA:  Slip and fall down 7-8 metal steps. Thoracolumbar back pain. EXAM: THORACIC SPINE - 3 VIEWS COMPARISON:  None. FINDINGS: Mild scoliotic curvature of the lower thoracic spine. Vertebral body heights are maintained. No evidence of fracture. Posterior elements appear intact. Mild disc space narrowing and endplate spurring, most prominent at T10-T11. There is no paravertebral soft tissue abnormality. IMPRESSION: Mild degenerative change without acute fracture of the thoracic spine. Electronically Signed   By: Narda Rutherford M.D.   On: 09/24/2018 03:51   Dg Lumbar Spine Complete  Result Date: 09/24/2018 CLINICAL DATA:  Slip and fall down 7-8 metal steps. Thoracolumbar back pain. EXAM: LUMBAR SPINE - COMPLETE 4+ VIEW COMPARISON:  None. FINDINGS: There are 6 non-rib-bearing lumbar vertebra. Broad-based leftward curvature of the upper lumbar spine. Vertebral body heights are normal. There is no listhesis. The posterior elements are intact. Disc spaces are preserved. No fracture. Sacroiliac joints are symmetric and normal. IMPRESSION: Mild scoliotic curvature.  No fracture of the lumbar spine. Electronically Signed   By: Narda Rutherford M.D.   On: 09/24/2018 03:54   Dg Wrist Complete Left  Result Date: 09/24/2018 CLINICAL DATA:  Slip and fall down 7-8 metal steps. Left wrist pain. EXAM: LEFT WRIST - COMPLETE 3+ VIEW COMPARISON:  None. FINDINGS: There is no evidence of fracture or dislocation. There is no evidence of arthropathy or other focal bone abnormality. Soft tissues are unremarkable. IMPRESSION: Negative radiographs of the left wrist. Electronically Signed   By: Narda Rutherford M.D.   On: 09/24/2018 03:48   Dg Shoulder Left  Result Date: 09/24/2018 CLINICAL DATA:  Slip and fall down 7-8 metal steps. Left shoulder pain. EXAM: LEFT SHOULDER - 2+ VIEW COMPARISON:  None. FINDINGS: There is no evidence of fracture or dislocation. There is no evidence of arthropathy or  other focal bone abnormality. Soft tissues are unremarkable. IMPRESSION: Negative radiographs of the left shoulder. Electronically Signed   By: Narda Rutherford M.D.   On: 09/24/2018 03:46    Procedures Procedures   Medications Ordered in ED Medications  oxyCODONE (Oxy IR/ROXICODONE) immediate release tablet 5 mg (5 mg Oral Given 09/24/18 0342)  ketorolac (TORADOL) 30 MG/ML injection 30 mg (30 mg Intravenous Given 09/24/18 0436)  morphine 4 MG/ML injection 4 mg (4 mg Intravenous Given 09/24/18 0436)  methocarbamol (ROBAXIN) 1,000 mg in dextrose 5 % 50 mL IVPB (0 mg Intravenous Stopped 09/24/18 0527)     Initial Impression / Assessment and Plan / ED Course  I have reviewed the triage vital signs and the nursing notes.  Pertinent labs & imaging results that were available during my care of the patient were reviewed by me and considered in my medical decision making (see chart for details).  Fall with pain to the upper and lower back, left  shoulder, left wrist.  He is being sent for x-rays.  Old records are reviewed, and he has no relevant past visits.  He is given a dose of oxycodone for pain.  X-rays show no evidence of fracture.  He had little relief with oxycodone.  IV is started and is given morphine, ketorolac, methocarbamol.   He had good relief with above-noted treatment.  He is discharged with prescription for methocarbamol, advised to take over-the-counter naproxen.  Also given prescription for small number of oxycodone tablets.  Advised to apply ice.  Return precautions discussed.  Final Clinical Impressions(s) / ED Diagnoses   Final diagnoses:  Fall (on) (from) other stairs and steps, initial encounter  Contusion of back, unspecified laterality, initial encounter    ED Discharge Orders         Ordered    oxyCODONE (ROXICODONE) 5 MG immediate release tablet  Every 4 hours PRN     09/24/18 0542    methocarbamol (ROBAXIN) 500 MG tablet  4 times daily PRN     09/24/18 0542            Dione Booze, MD 09/24/18 [phone]

## 2018-09-24 NOTE — Discharge Instructions (Signed)
Apply ice for 30 minutes, four times a day.  Take naproxen (Aleve) - two tablets at a time, twice a day.

## 2018-09-24 NOTE — ED Triage Notes (Signed)
Pt reports he was leaving work and slipped down approx 7-8 metal steps. He c/o pain in the lower back, left wrist/arm.

## 2018-09-28 ENCOUNTER — Encounter (HOSPITAL_COMMUNITY): Payer: Self-pay

## 2018-09-28 ENCOUNTER — Other Ambulatory Visit: Payer: Self-pay

## 2018-09-28 ENCOUNTER — Emergency Department (HOSPITAL_COMMUNITY)
Admission: EM | Admit: 2018-09-28 | Discharge: 2018-09-28 | Disposition: A | Discharge status: Home / Self Care | Payer: Self-pay | Attending: Emergency Medicine | Admitting: Emergency Medicine

## 2018-09-28 DIAGNOSIS — M546 Pain in thoracic spine: Secondary | ICD-10-CM | POA: Insufficient documentation

## 2018-09-28 DIAGNOSIS — I10 Essential (primary) hypertension: Secondary | ICD-10-CM | POA: Insufficient documentation

## 2018-09-28 DIAGNOSIS — M545 Low back pain, unspecified: Secondary | ICD-10-CM

## 2018-09-28 MED ORDER — ETODOLAC 300 MG PO CAPS: 300.0000 mg | ORAL_CAPSULE | Freq: Three times a day (TID) | ORAL | 1 refills | Status: DC

## 2018-09-28 MED ORDER — METHOCARBAMOL 500 MG PO TABS: 500.0000 mg | ORAL_TABLET | Freq: Four times a day (QID) | ORAL | 0 refills | Status: DC | PRN

## 2018-09-28 NOTE — ED Notes (Signed)
rx x 2 given and work note

## 2018-09-28 NOTE — ED Triage Notes (Signed)
Patient states he fell down 8 metal steps steps onto his back and now has, back pain,mainly upper back left shoulder and arm pain. Patient also c/o generalized pain.  patient states the fall occurred on 09/24/18.

## 2018-09-28 NOTE — ED Provider Notes (Signed)
[facility]-EMERGENCY DEPT Provider Note   CSN: [id number] Arrival date & time: 09/28/18  1333     History   Chief Complaint Chief Complaint  Patient presents with  . Fall    HPI Mark Young is a 37 y.o. male.  HPI Patient presents to the emergency room for evaluation of persistent pain after a fall.  Patient states he fell after slipping down metal steps 4-5 days ago.  This injury occurred while the patient was at work.  He was initially able to get up and walk however he started having increasing in pain in his upper back and lower back left wrist and shoulder.  He went to the emergency room and was seen early in the morning of October 20.  Patient had x-rays of his thoracic and lumbar spine.  He also had x-rays of his shoulder and wrist.  Patient states he was referred to occupational therapy.  He has seen them and is scheduled for possible MRI.  Patient states he still having pain and he ran out of the pain medications he was given.  He was taking oxycodone and Robaxin.  Patient states that he tried to get in touch with occupational medicine today but was unable to do so.  He is having a lot of pain and needs something to take.  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  Patient has some soreness all over but no new injuries. Past Medical History:  Diagnosis Date  . Hypertension     There are no active problems to display for this patient.   History reviewed. No pertinent surgical history.      Home Medications    Prior to Admission medications   Medication Sig Start Date End Date Taking? Authorizing Provider  oxyCODONE (ROXICODONE) 5 MG immediate release tablet Take 1-2 tablets (5-10 mg total) by mouth every 4 (four) hours as needed for severe pain. 09/24/18  Yes Dione Booze, MD  etodolac (LODINE) 300 MG capsule Take 1 capsule (300 mg total) by mouth every 8 (eight) hours. 09/28/18   Linwood Dibbles, MD    methocarbamol (ROBAXIN) 500 MG tablet Take 1-2 tablets (500-1,000 mg total) by mouth 4 (four) times daily as needed for muscle spasms. 09/28/18   Linwood Dibbles, MD    Family History Family History  Problem Relation Age of Onset  . Diabetes Mother     Social History Social History   Tobacco Use  . Smoking status: Never Smoker  . Smokeless tobacco: Never Used  Substance Use Topics  . Alcohol use: Yes    Comment: occ  . Drug use: Yes    Types: Marijuana     Allergies   Acetaminophen and Septra [sulfamethoxazole-trimethoprim]   Review of Systems Review of Systems  All other systems reviewed and are negative.    Physical Exam Updated Vital Signs BP (!) 156/92 (BP Location: Right Arm)   Pulse 84   Temp 98.3 F (36.8 C) (Oral)   Resp 15   Ht 1.905 m (6\' 3" )   Wt 102.1 kg   SpO2 99%   BMI 28.12 kg/m   Physical Exam  Constitutional: He appears well-developed and well-nourished. No distress.  HENT:  Head: Normocephalic and atraumatic.  Right Ear: External ear normal.  Left Ear: External ear normal.  Eyes: Conjunctivae are normal. Right eye exhibits no discharge. Left eye exhibits no discharge. No scleral icterus.  Neck: Neck supple. No tracheal deviation  present.  Cardiovascular: Normal rate, regular rhythm and intact distal pulses.  Pulmonary/Chest: Effort normal and breath sounds normal. No stridor. No respiratory distress. He has no wheezes. He has no rales.  Abdominal: Soft. Bowel sounds are normal. He exhibits no distension. There is no tenderness. There is no rebound and no guarding.  Musculoskeletal: He exhibits no edema.       Thoracic back: He exhibits tenderness.       Lumbar back: He exhibits tenderness.  Left wrist in a brace, mild soreness bilateral in knees but full ROM, no bone ttp  Neurological: He is alert. He has normal strength. No cranial nerve deficit (no facial droop, extraocular movements intact, no slurred speech) or sensory deficit. He  exhibits normal muscle tone. He displays no seizure activity. Coordination normal.  Skin: Skin is warm and dry. No rash noted.  Psychiatric: He has a normal mood and affect.  Nursing note and vitals reviewed.    ED Treatments / Results   Procedures Procedures (including critical care time)  Medications Ordered in ED Medications - No data to display   Initial Impression / Assessment and Plan / ED Course  I have reviewed the triage vital signs and the nursing notes.  Pertinent labs & imaging results that were available during my care of the patient were reviewed by me and considered in my medical decision making (see chart for details).   I discussed the previous findings with the patient.  I asked him whether he has any new injuries or areas of the tenderness that we should x-ray.  He denies any new injuries.  I will give the patient a prescription for Robaxin and Lodine to take for pain.  I will give him a note so he does not have to work tomorrow but I recommended he contact occupational health for any further duty restrictions  Final Clinical Impressions(s) / ED Diagnoses   Final diagnoses:  Acute midline thoracic back pain  Lumbar pain    ED Discharge Orders         Ordered    methocarbamol (ROBAXIN) 500 MG tablet  4 times daily PRN     09/28/18 1637    etodolac (LODINE) 300 MG capsule  Every 8 hours    Note to Pharmacy:  As needed for pain   09/28/18 1637           Linwood Dibbles, MD 09/28/18 1643

## 2018-09-28 NOTE — ED Notes (Signed)
Bed: WHALB Expected date:  Expected time:  Means of arrival:  Comments: 

## 2018-09-28 NOTE — Discharge Instructions (Addendum)
Take the medications as needed for pain, follow-up with the occupational health as planned

## 2018-09-30 ENCOUNTER — Encounter (HOSPITAL_COMMUNITY): Payer: Self-pay

## 2018-09-30 ENCOUNTER — Emergency Department (HOSPITAL_COMMUNITY)
Admission: EM | Admit: 2018-09-30 | Discharge: 2018-09-30 | Disposition: A | Discharge status: Home / Self Care | Payer: Self-pay | Attending: Emergency Medicine | Admitting: Emergency Medicine

## 2018-09-30 ENCOUNTER — Other Ambulatory Visit: Payer: Self-pay

## 2018-09-30 DIAGNOSIS — M546 Pain in thoracic spine: Secondary | ICD-10-CM | POA: Insufficient documentation

## 2018-09-30 DIAGNOSIS — W108XXD Fall (on) (from) other stairs and steps, subsequent encounter: Secondary | ICD-10-CM

## 2018-09-30 DIAGNOSIS — Z79899 Other long term (current) drug therapy: Secondary | ICD-10-CM | POA: Insufficient documentation

## 2018-09-30 DIAGNOSIS — M545 Low back pain, unspecified: Secondary | ICD-10-CM

## 2018-09-30 DIAGNOSIS — M25512 Pain in left shoulder: Secondary | ICD-10-CM | POA: Insufficient documentation

## 2018-09-30 DIAGNOSIS — M25532 Pain in left wrist: Secondary | ICD-10-CM | POA: Insufficient documentation

## 2018-09-30 DIAGNOSIS — W19XXXA Unspecified fall, initial encounter: Secondary | ICD-10-CM | POA: Insufficient documentation

## 2018-09-30 DIAGNOSIS — W19XXXD Unspecified fall, subsequent encounter: Secondary | ICD-10-CM

## 2018-09-30 DIAGNOSIS — M549 Dorsalgia, unspecified: Secondary | ICD-10-CM | POA: Insufficient documentation

## 2018-09-30 DIAGNOSIS — I10 Essential (primary) hypertension: Secondary | ICD-10-CM | POA: Insufficient documentation

## 2018-09-30 HISTORY — DX: Unspecified fall, initial encounter: W19.XXXA

## 2018-09-30 MED ORDER — DICLOFENAC SODIUM 50 MG PO TBEC: 50.0000 mg | DELAYED_RELEASE_TABLET | Freq: Two times a day (BID) | ORAL | 0 refills | Status: DC

## 2018-09-30 MED ORDER — KETOROLAC TROMETHAMINE 60 MG/2ML IM SOLN
30.0000 mg | Freq: Once | INTRAMUSCULAR | Status: AC
  Administered 2018-09-30: 30 mg via INTRAMUSCULAR
  Filled 2018-09-30: qty 2

## 2018-09-30 MED ORDER — TRAMADOL HCL 50 MG PO TABS: 50.0000 mg | ORAL_TABLET | Freq: Four times a day (QID) | ORAL | 0 refills | Status: DC | PRN

## 2018-09-30 NOTE — ED Triage Notes (Signed)
He tells me that he injured his upper back d/t a fall at work "I fell on slick wet stairs because it was raining". He states he is not slated to see his worker's comp. Doctor until next week, "but I'm out of the pain medicine they prescribed me". He also states "I was seen here yesterday, but the doctor told me he could only write me a note for one day; and I need a note to be longer than that and I need more pain medicine". He is ambulatory and in no distress. He c/o pain and soreness at upper back and left scapular areas.

## 2018-09-30 NOTE — ED Notes (Signed)
Bed: WTR8 Expected date:  Expected time:  Means of arrival:  Comments: 

## 2018-09-30 NOTE — ED Notes (Signed)
Bed: WHALC Expected date:  Expected time:  Means of arrival:  Comments: fall 
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
## 2018-09-30 NOTE — Discharge Instructions (Addendum)
Do not take the narcotic if driving as it will make you sleepy. Follow up for your MRI as scheduled.

## 2018-09-30 NOTE — ED Notes (Signed)
Bed: WTR5 Expected date:  Expected time:  Means of arrival:  Comments: 

## 2018-09-30 NOTE — ED Notes (Signed)
Pt is alert and oriented. Pt reports that he is upset that he has been waiting this long, and states " I feel like I have been pushed to the back burner " in terms of him receiving care. Pt made aware that the provider would be in soon.

## 2022-05-13 ENCOUNTER — Emergency Department (HOSPITAL_COMMUNITY): Payer: Self-pay

## 2022-05-13 ENCOUNTER — Other Ambulatory Visit: Payer: Self-pay

## 2022-05-13 ENCOUNTER — Emergency Department (HOSPITAL_COMMUNITY)
Admission: EM | Admit: 2022-05-13 | Discharge: 2022-05-13 | Disposition: A | Discharge status: Home / Self Care | Payer: Self-pay | Attending: Emergency Medicine | Admitting: Emergency Medicine

## 2022-05-13 ENCOUNTER — Encounter (HOSPITAL_COMMUNITY): Payer: Self-pay | Admitting: Emergency Medicine

## 2022-05-13 DIAGNOSIS — M79652 Pain in left thigh: Secondary | ICD-10-CM | POA: Insufficient documentation

## 2022-05-13 DIAGNOSIS — Y92009 Unspecified place in unspecified non-institutional (private) residence as the place of occurrence of the external cause: Secondary | ICD-10-CM

## 2022-05-13 DIAGNOSIS — M549 Dorsalgia, unspecified: Secondary | ICD-10-CM | POA: Insufficient documentation

## 2022-05-13 DIAGNOSIS — M25552 Pain in left hip: Secondary | ICD-10-CM | POA: Insufficient documentation

## 2022-05-13 DIAGNOSIS — W01190A Fall on same level from slipping, tripping and stumbling with subsequent striking against furniture, initial encounter: Secondary | ICD-10-CM | POA: Insufficient documentation

## 2022-05-13 DIAGNOSIS — T07XXXA Unspecified multiple injuries, initial encounter: Secondary | ICD-10-CM

## 2022-05-13 DIAGNOSIS — M25512 Pain in left shoulder: Secondary | ICD-10-CM | POA: Insufficient documentation

## 2022-05-13 DIAGNOSIS — S0083XA Contusion of other part of head, initial encounter: Secondary | ICD-10-CM | POA: Insufficient documentation

## 2022-05-13 DIAGNOSIS — M25532 Pain in left wrist: Secondary | ICD-10-CM | POA: Insufficient documentation

## 2022-05-13 LAB — CBC WITH DIFFERENTIAL/PLATELET
Abs Immature Granulocytes: 0.03 10*3/uL (ref 0.00–0.07)
Basophils Absolute: 0 10*3/uL (ref 0.0–0.1)
Basophils Relative: 0 %
Eosinophils Absolute: 0.1 10*3/uL (ref 0.0–0.5)
Eosinophils Relative: 1 %
HCT: 46.8 % (ref 39.0–52.0)
Hemoglobin: 14.8 g/dL (ref 13.0–17.0)
Immature Granulocytes: 0 %
Lymphocytes Relative: 17 %
Lymphs Abs: 1.4 10*3/uL (ref 0.7–4.0)
MCH: 28.5 pg (ref 26.0–34.0)
MCHC: 31.6 g/dL (ref 30.0–36.0)
MCV: 90 fL (ref 80.0–100.0)
Monocytes Absolute: 0.7 10*3/uL (ref 0.1–1.0)
Monocytes Relative: 8 %
Neutro Abs: 6.1 10*3/uL (ref 1.7–7.7)
Neutrophils Relative %: 74 %
Platelets: 130 10*3/uL — ABNORMAL LOW (ref 150–400)
RBC: 5.2 MIL/uL (ref 4.22–5.81)
RDW: 13.4 % (ref 11.5–15.5)
WBC: 8.3 10*3/uL (ref 4.0–10.5)
nRBC: 0 % (ref 0.0–0.2)

## 2022-05-13 LAB — BASIC METABOLIC PANEL
Anion gap: 8 (ref 5–15)
BUN: 9 mg/dL (ref 6–20)
CO2: 21 mmol/L — ABNORMAL LOW (ref 22–32)
Calcium: 9.1 mg/dL (ref 8.9–10.3)
Chloride: 110 mmol/L (ref 98–111)
Creatinine, Ser: 0.78 mg/dL (ref 0.61–1.24)
GFR, Estimated: >60 mL/min (ref >60)
Glucose, Bld: 91 mg/dL (ref 70–99)
Potassium: 3.5 mmol/L (ref 3.5–5.1)
Sodium: 139 mmol/L (ref 135–145)

## 2022-05-13 MED ORDER — OXYCODONE HCL 5 MG PO TABS: 5.0000 mg | ORAL_TABLET | Freq: Four times a day (QID) | ORAL | 0 refills | Status: AC | PRN

## 2022-05-13 MED ORDER — HYDROMORPHONE HCL 1 MG/ML IJ SOLN
0.5000 mg | Freq: Once | INTRAMUSCULAR | Status: AC
  Administered 2022-05-13: 0.5 mg via INTRAVENOUS
  Filled 2022-05-13: qty 1

## 2022-05-13 MED ORDER — HYDROMORPHONE HCL 1 MG/ML IJ SOLN
1.0000 mg | Freq: Once | INTRAMUSCULAR | Status: AC
  Administered 2022-05-13: 1 mg via INTRAVENOUS
  Filled 2022-05-13: qty 1

## 2022-05-13 MED ORDER — ONDANSETRON HCL 4 MG/2ML IJ SOLN
4.0000 mg | Freq: Once | INTRAMUSCULAR | Status: AC
  Administered 2022-05-13: 4 mg via INTRAVENOUS
  Filled 2022-05-13: qty 2

## 2022-05-13 MED ORDER — CYCLOBENZAPRINE HCL 10 MG PO TABS: 10.0000 mg | ORAL_TABLET | Freq: Two times a day (BID) | ORAL | 0 refills | Status: DC | PRN

## 2022-05-13 MED ORDER — TETANUS-DIPHTH-ACELL PERTUSSIS 5-2.5-18.5 LF-MCG/0.5 IM SUSY
0.5000 mL | PREFILLED_SYRINGE | Freq: Once | INTRAMUSCULAR | Status: DC
  Filled 2022-05-13: qty 0.5

## 2022-05-13 NOTE — ED Provider Notes (Signed)
[facility] EMERGENCY DEPARTMENT Provider Note   CSN: [id number] Arrival date & time: 05/13/22  1032     History  Chief Complaint  Patient presents with   Marletta Lor    Mark Young is a 41 y.o. male.  Patient presents via EMS after falling and tripping this morning.  States he tripped over a rug that was in his hallway and fell forward on his left side against a glass table that had a wooden edge.  The table shattered.  He believes the TV fell off the TV stand and hit him on the head.  He thinks he did lose consciousness.  Complains of pain to his back, left hip, left wrist and left shoulder.  Was not able to walk for EMS due to weakness pain in his leg and his back.  No blood thinner use.  States he may have lost consciousness.  Denies any neck, chest or abdominal pain.  Complains of pain to his left wrist, left shoulder, left hip.  Denies any numbness or tingling in his left arm but does feel some weakness.  The history is provided by the patient and the EMS personnel.  Fall Associated symptoms include headaches. Pertinent negatives include no chest pain, no abdominal pain and no shortness of breath.       Home Medications Prior to Admission medications   Not on File      Allergies    Septra [sulfamethoxazole-trimethoprim] and Tylenol [acetaminophen]    Review of Systems   Review of Systems  Constitutional:  Negative for activity change, appetite change, fatigue and fever.  HENT:  Negative for congestion and rhinorrhea.   Eyes:  Negative for visual disturbance.  Respiratory:  Negative for chest tightness and shortness of breath.   Cardiovascular:  Negative for chest pain and leg swelling.  Gastrointestinal:  Negative for abdominal pain, nausea and vomiting.  Genitourinary:  Negative for dysuria, hematuria and urgency.  Musculoskeletal:  Positive for arthralgias, back pain and myalgias. Negative for neck pain.  Skin:  Positive for wound.  Neurological:   Positive for weakness and headaches. Negative for dizziness.   all other systems are negative except as noted in the HPI and PMH.    Physical Exam Updated Vital Signs BP (!) 134/100 (BP Location: Right Arm)   Pulse 78   Temp 98.2 F (36.8 C) (Oral)   Resp (!) 24   SpO2 100%  Physical Exam Vitals and nursing note reviewed.  Constitutional:      General: He is not in acute distress.    Appearance: Normal appearance. He is well-developed and normal weight. He is not ill-appearing.  HENT:     Head: Normocephalic.     Comments: Abrasion hematoma left forehead.    Mouth/Throat:     Pharynx: No oropharyngeal exudate.  Eyes:     Conjunctiva/sclera: Conjunctivae normal.     Pupils: Pupils are equal, round, and reactive to light.  Neck:     Comments: C-collar in place.  No midline tenderness.  No step-offs Cardiovascular:     Rate and Rhythm: Normal rate and regular rhythm.     Heart sounds: Normal heart sounds. No murmur heard. Pulmonary:     Effort: Pulmonary effort is normal. No respiratory distress.     Breath sounds: Normal breath sounds.  Chest:     Chest wall: No tenderness.  Abdominal:     Palpations: Abdomen is soft.     Tenderness: There is no abdominal tenderness. There is  no guarding or rebound.  Musculoskeletal:        General: Tenderness present. Normal range of motion.     Cervical back: Normal range of motion and neck supple.     Comments: TTP L wrist without deformity. No snuffbox tenderness. TTP lumbar spine without stepoffs. FROM L hip with pain. Pelvis stable. No shortening or external rotation.  No laceration seen in area of FB on xray.  Pain with ROM of shoulder on L. No deformity.  Skin:    General: Skin is warm.  Neurological:     Mental Status: He is alert.     Cranial Nerves: No cranial nerve deficit.     Motor: No abnormal muscle tone.     Coordination: Coordination normal.     Comments: CN 2-12 intact. 5/5 strength on R. Decreased grip strength  on L with weakness in forearm flexion and extension.  5/5 strength in lower extremities.  Psychiatric:        Behavior: Behavior normal.     ED Results / Procedures / Treatments   Labs (all labs ordered are listed, but only abnormal results are displayed) Labs Reviewed  CBC WITH DIFFERENTIAL/PLATELET - Abnormal; Notable for the following components:      Result Value   Platelets 130 (*)    All other components within normal limits  BASIC METABOLIC PANEL - Abnormal; Notable for the following components:   CO2 21 (*)    All other components within normal limits    EKG None  Radiology CT Hip Left Wo Contrast  Result Date: 05/13/2022 CLINICAL DATA:  Hip trauma, fracture suspected.  Fall. EXAM: CT OF THE LEFT HIP WITHOUT CONTRAST TECHNIQUE: Multidetector CT imaging of the left hip was performed according to the standard protocol. Multiplanar CT image reconstructions were also generated. RADIATION DOSE REDUCTION: This exam was performed according to the departmental dose-optimization program which includes automated exposure control, adjustment of the mA and/or kV according to patient size and/or use of iterative reconstruction technique. COMPARISON:  None Available. FINDINGS: Bones/Joint/Cartilage No evidence of fracture or dislocation. Mild superolateral left hip joint space narrowing with subchondral cystic changes. No appreciable joint effusion. Ligaments Suboptimally assessed by CT. Muscles and Tendons Muscles are normal in bulk. No evidence of tendon tear. No intramuscular hematoma or fluid collection. Soft tissues Skin and subcutaneous soft tissues are within normal limits. IMPRESSION: 1.  No evidence of fracture or dislocation. 2.  No significant arthropathy. 3.  Muscles, tendons and subcutaneous soft tissues are unremarkable. Electronically Signed   By: Larose Hires D.O.   On: 05/13/2022 15:15   CT Shoulder Left Wo Contrast  Result Date: 05/13/2022 CLINICAL DATA:  Fall, left shoulder  pain. EXAM: CT OF THE UPPER LEFT EXTREMITY WITHOUT CONTRAST TECHNIQUE: Multidetector CT imaging of the upper left extremity was performed according to the standard protocol. RADIATION DOSE REDUCTION: This exam was performed according to the departmental dose-optimization program which includes automated exposure control, adjustment of the mA and/or kV according to patient size and/or use of iterative reconstruction technique. COMPARISON:  None Available. FINDINGS: Bones/Joint/Cartilage Scratch set no fracture or dislocation. Normal alignment. No joint effusion. No significant arthropathy of the glenohumeral or acromioclavicular joints. Ligaments Ligaments are suboptimally evaluated by CT. Muscles and Tendons Muscles are normal in bulk. No intramuscular hematoma or fluid collection. Soft tissue No fluid collection or hematoma. No soft tissue mass. Visualized left lung is clear. IMPRESSION: 1.  No evidence of fracture or dislocation. 2.  Muscles and tendons are  unremarkable. Electronically Signed   By: Larose Hires D.O.   On: 05/13/2022 15:09   CT Lumbar Spine Wo Contrast  Result Date: 05/13/2022 CLINICAL DATA:  Back trauma, no prior imaging (Age >= 16y) EXAM: CT LUMBAR SPINE WITHOUT CONTRAST TECHNIQUE: Multidetector CT imaging of the lumbar spine was performed without intravenous contrast administration. Multiplanar CT image reconstructions were also generated. RADIATION DOSE REDUCTION: This exam was performed according to the departmental dose-optimization program which includes automated exposure control, adjustment of the mA and/or kV according to patient size and/or use of iterative reconstruction technique. COMPARISON:  None Available. FINDINGS: Segmentation: Partial lumbarization of S1. Alignment: Preserved. Vertebrae: Vertebral body heights are maintained. No acute fracture. Paraspinal and other soft tissues: Unremarkable. Disc levels: Intervertebral disc heights are maintained. IMPRESSION: No acute  fracture. Electronically Signed   By: Guadlupe Spanish M.D.   On: 05/13/2022 15:06   DG Hip Unilat With Pelvis 2-3 Views Left  Addendum Date: 05/13/2022   ADDENDUM REPORT: 05/13/2022 14:24 ADDENDUM: Three additional views of the left hip were acquired after removal of patient's clothing, on these 3 images the radiopaque object is no longer evident and as such was external to the patient. Electronically Signed   By: Maudry Mayhew M.D.   On: 05/13/2022 14:24   Result Date: 05/13/2022 CLINICAL DATA:  Left hip pain radiating to lower back after fall on coffee table. EXAM: DG HIP (WITH OR WITHOUT PELVIS) 2-3V LEFT COMPARISON:  None Available. FINDINGS: There is no evidence of hip fracture or dislocation. There is no evidence of arthropathy or other focal bone abnormality. 9 mm radiopaque focus projects over the subcutaneous tissues of the left hip lateral to the greater trochanter. IMPRESSION: No acute osseous abnormality. 9 mm radiopaque focus projects over the subcutaneous tissues of the left hip lateral to the greater trochanter possibly reflecting a foreign body correlation with direct visualization suggested. Electronically Signed: By: Maudry Mayhew M.D. On: 05/13/2022 11:21   CT Head Wo Contrast  Result Date: 05/13/2022 CLINICAL DATA:  Larey Seat down stairs, head and neck trauma. EXAM: CT HEAD WITHOUT CONTRAST CT CERVICAL SPINE WITHOUT CONTRAST TECHNIQUE: Multidetector CT imaging of the head and cervical spine was performed following the standard protocol without intravenous contrast. Multiplanar CT image reconstructions of the cervical spine were also generated. RADIATION DOSE REDUCTION: This exam was performed according to the departmental dose-optimization program which includes automated exposure control, adjustment of the mA and/or kV according to patient size and/or use of iterative reconstruction technique. COMPARISON:  None Available. FINDINGS: CT HEAD FINDINGS Brain: No evidence of acute infarction,  hemorrhage, cerebral edema, mass, mass effect, or midline shift. No hydrocephalus or extra-axial fluid collection. Vascular: No hyperdense vessel. Skull: Normal. Negative for fracture or focal lesion. Sinuses/Orbits: Left maxillary mucous retention cyst. Mild mucosal thickening in the ethmoid air cells. The orbits are unremarkable. Other: The mastoid air cells are well aerated. CT CERVICAL SPINE FINDINGS Alignment: Physiologic. Skull base and vertebrae: No acute fracture. No primary bone lesion or focal pathologic process. Soft tissues and spinal canal: No prevertebral fluid or swelling. No visible canal hematoma. Disc levels:  Disc heights are preserved.  No spinal canal stenosis. Upper chest: Negative. Other: None. IMPRESSION: 1.  No acute intracranial process. 2.  No acute fracture or traumatic listhesis in the cervical spine. Electronically Signed   By: Wiliam Ke M.D.   On: 05/13/2022 11:43   CT Cervical Spine Wo Contrast  Result Date: 05/13/2022 CLINICAL DATA:  Larey Seat down stairs, head and  neck trauma. EXAM: CT HEAD WITHOUT CONTRAST CT CERVICAL SPINE WITHOUT CONTRAST TECHNIQUE: Multidetector CT imaging of the head and cervical spine was performed following the standard protocol without intravenous contrast. Multiplanar CT image reconstructions of the cervical spine were also generated. RADIATION DOSE REDUCTION: This exam was performed according to the departmental dose-optimization program which includes automated exposure control, adjustment of the mA and/or kV according to patient size and/or use of iterative reconstruction technique. COMPARISON:  None Available. FINDINGS: CT HEAD FINDINGS Brain: No evidence of acute infarction, hemorrhage, cerebral edema, mass, mass effect, or midline shift. No hydrocephalus or extra-axial fluid collection. Vascular: No hyperdense vessel. Skull: Normal. Negative for fracture or focal lesion. Sinuses/Orbits: Left maxillary mucous retention cyst. Mild mucosal thickening  in the ethmoid air cells. The orbits are unremarkable. Other: The mastoid air cells are well aerated. CT CERVICAL SPINE FINDINGS Alignment: Physiologic. Skull base and vertebrae: No acute fracture. No primary bone lesion or focal pathologic process. Soft tissues and spinal canal: No prevertebral fluid or swelling. No visible canal hematoma. Disc levels:  Disc heights are preserved.  No spinal canal stenosis. Upper chest: Negative. Other: None. IMPRESSION: 1.  No acute intracranial process. 2.  No acute fracture or traumatic listhesis in the cervical spine. Electronically Signed   By: Wiliam KeAlison  Vasan M.D.   On: 05/13/2022 11:43   DG Wrist Complete Left  Result Date: 05/13/2022 CLINICAL DATA:  Pain after fall on coffee table EXAM: LEFT WRIST - COMPLETE 3+ VIEW COMPARISON:  Radiograph September 24, 2018 FINDINGS: There is no evidence of fracture or dislocation. There is no evidence of arthropathy or other focal bone abnormality. Soft tissues are unremarkable. IMPRESSION: No acute osseous abnormality. Electronically Signed   By: Maudry MayhewJeffrey  Waltz M.D.   On: 05/13/2022 11:24   DG Shoulder Left  Result Date: 05/13/2022 CLINICAL DATA:  Shoulder pain after fall. EXAM: LEFT SHOULDER - 2+ VIEW COMPARISON:  Radiograph September 24, 2018. FINDINGS: There is no evidence of fracture or dislocation. There is no evidence of arthropathy or other focal bone abnormality. Soft tissues are unremarkable. IMPRESSION: No acute osseous abnormality. Electronically Signed   By: Maudry MayhewJeffrey  Waltz M.D.   On: 05/13/2022 11:24   DG Lumbar Spine Complete  Result Date: 05/13/2022 CLINICAL DATA:  Pain after fall on coffee table. EXAM: LUMBAR SPINE - COMPLETE 4+ VIEW COMPARISON:  Radiograph September 24, 2018 FINDINGS: Six non-rib-bearing lumbar type vertebral bodies. There is no evidence of lumbar spine fracture. Mild levoconvex curvature of the lumbar spine. Mild L6-S1 discogenic disease. IMPRESSION: No acute osseous abnormality. Mild L6-S1 discogenic  disease. Electronically Signed   By: Maudry MayhewJeffrey  Waltz M.D.   On: 05/13/2022 11:23    Procedures Procedures    Medications Ordered in ED Medications  HYDROmorphone (DILAUDID) injection 1 mg (has no administration in time range)  Tdap (BOOSTRIX) injection 0.5 mL (has no administration in time range)  ondansetron (ZOFRAN) injection 4 mg (4 mg Intravenous Given 05/13/22 1157)  HYDROmorphone (DILAUDID) injection 0.5 mg (0.5 mg Intravenous Given 05/13/22 1157)  HYDROmorphone (DILAUDID) injection 0.5 mg (0.5 mg Intravenous Given 05/13/22 1312)    ED Course/ Medical Decision Making/ A&P Clinical Course as of 05/13/22 1331  Thu May 13, 2022  1331 DG Shoulder Left [WF]    Clinical Course User Index [WF] Gailen ShelterFondaw, Wylder S, [place]                           Medical Decision Making Amount and/or Complexity  of Data Reviewed Independent Historian: EMS Labs: ordered. Decision-making details documented in ED Course. Radiology: ordered and independent interpretation performed. Decision-making details documented in ED Course. ECG/medicine tests: ordered and independent interpretation performed. Decision-making details documented in ED Course.  Risk Prescription drug management.   Patient reports trip and fall on a rug onto his buttocks and left side.  Did lose consciousness.  GCS is 15, ABCs are intact.  Complains of severe pain to his left shoulder, wrist, hip and buttocks.  Initial imaging is triage is negative.  Results reviewed and interpreted by me.  CT head and C-spine are negative for acute traumatic pathology.  His wound to his forehead is not amenable to suturing. X-ray of hip is negative with questionable foreign body laterally.  This is not seen visually and there is no wound in this area.  Additional CT imaging of L shoulder, L hip, and lumbar spine is negative for acute traumatic injury. Foreign body is no longer visible on Xray after clothing removal and was therefore external to the patient.    On reasessment, patient still with weakness in L arm. Difficult to tell whether this is due to pain. Patient does have issues with L shoulder chronically, but doesn't usually have grip weakness and forearm weakness. Therefore will pursue C spine MRI to assess for occult cervical spine injury.   Will attempt ambulation on L hip, no fractures seen.   Dr. Anitra Lauth to assume care at shift change pending MRI. Anticipate discharge home with supportive care if patient able to ambulate and results reassuring.         Final Clinical Impression(s) / ED Diagnoses Final diagnoses:  None    Rx / DC Orders ED Discharge Orders     None         Alvey Brockel, Jeannett Senior, MD 05/13/22 1726

## 2022-05-13 NOTE — ED Notes (Signed)
Mark Young mother [phone] updated number, asking to speak to the patient

## 2022-05-13 NOTE — ED Notes (Signed)
Pt ambulated to bathroom with assistance. Pt stated he felt weak and unsteady. Pt had an unsteady gait and was shaking.

## 2022-05-13 NOTE — Discharge Instructions (Addendum)
All the x-rays today look normal.  There is no evidence of any problem with your spinal cord.  You are going to be very sore tomorrow.  Be sure you are using a heating pad or hot shower and muscle rub in addition to the medication.  If you take the medication make sure you do not drive

## 2022-05-13 NOTE — ED Provider Triage Note (Signed)
Emergency Medicine Provider Triage Evaluation Note  Mark Young , a 41 y.o. male  was evaluated in triage.  Pt complains of injuries from a fall, pt states he was walking and tripped on a rug, fell into a glass table. States he hit his head and got knocked out, not on thinners. Pain in left hip, low back, head, left wrist and shoulder. Brought in by EMS, unable to bear weight due to hip pain.   Review of Systems  Positive: Joint pain as above, lac to face Negative: Weakness, numbness  Physical Exam  There were no vitals taken for this visit. Gen:   Awake, no distress   Resp:  Normal effort  MSK:   TTP left hip, left wrist, left shoulder, c-collar in place. Sensation intact all 4 ext. Other:    Medical Decision Making  Medically screening exam initiated at 10:33 AM.  Appropriate orders placed.  Mark Young was informed that the remainder of the evaluation will be completed by another provider, this initial triage assessment does not replace that evaluation, and the importance of remaining in the ED until their evaluation is complete.     Jeannie Fend, PA-C 05/13/22 1038

## 2022-05-13 NOTE — ED Triage Notes (Signed)
Pt arrives via EMS after a trip and fall into a glass coffee table. Pt fell onto it, pos LOC and broke the table. Pt has lac and hematoma to left forehead. Complains of back and hip pain, headache. Pt in C collar. Unable to walk for EMS due to pain. No obvious deformities on first exam. BP 128/95, HR 80. Pain still extreme after fentanyl from EMS.

## 2022-05-13 NOTE — ED Notes (Signed)
Bronson Ing mother [phone] call when patient is discharged

## 2022-05-13 NOTE — ED Provider Notes (Signed)
I assumed care from Dr. Manus Gunning at 4 PM.  Patient waiting on MR of the cervical spine to rule out cord injury.  Patient's MRI showed no evidence of acute osseous or ligamentous injury and no evidence of spinal cord trauma.  Findings discussed with the patient.  C-spine is cleared.  Patient with some improved strength in the arm.  Will discharge home with supportive care and follow-up.   Gwyneth Sprout, MD 05/13/22 2001

## 2022-08-11 ENCOUNTER — Emergency Department (HOSPITAL_COMMUNITY)
Admission: EM | Admit: 2022-08-11 | Discharge: 2022-08-11 | Disposition: A | Discharge status: Home / Self Care | Payer: Self-pay | Attending: Emergency Medicine | Admitting: Emergency Medicine

## 2022-08-11 ENCOUNTER — Encounter (HOSPITAL_COMMUNITY): Payer: Self-pay

## 2022-08-11 ENCOUNTER — Other Ambulatory Visit: Payer: Self-pay

## 2022-08-11 ENCOUNTER — Emergency Department (HOSPITAL_COMMUNITY): Payer: Self-pay

## 2022-08-11 DIAGNOSIS — R079 Chest pain, unspecified: Secondary | ICD-10-CM

## 2022-08-11 DIAGNOSIS — R0789 Other chest pain: Secondary | ICD-10-CM | POA: Insufficient documentation

## 2022-08-11 LAB — BASIC METABOLIC PANEL
Anion gap: 8 (ref 5–15)
BUN: 8 mg/dL (ref 6–20)
CO2: 24 mmol/L (ref 22–32)
Calcium: 9.4 mg/dL (ref 8.9–10.3)
Chloride: 105 mmol/L (ref 98–111)
Creatinine, Ser: 0.85 mg/dL (ref 0.61–1.24)
GFR, Estimated: >60 mL/min (ref >60)
Glucose, Bld: 89 mg/dL (ref 70–99)
Potassium: 3.7 mmol/L (ref 3.5–5.1)
Sodium: 137 mmol/L (ref 135–145)

## 2022-08-11 LAB — CBC
HCT: 43.3 % (ref 39.0–52.0)
Hemoglobin: 15.2 g/dL (ref 13.0–17.0)
MCH: 29 pg (ref 26.0–34.0)
MCHC: 35.1 g/dL (ref 30.0–36.0)
MCV: 82.6 fL (ref 80.0–100.0)
Platelets: 137 10*3/uL — ABNORMAL LOW (ref 150–400)
RBC: 5.24 MIL/uL (ref 4.22–5.81)
RDW: 13.3 % (ref 11.5–15.5)
WBC: 6.5 10*3/uL (ref 4.0–10.5)
nRBC: 0 % (ref 0.0–0.2)

## 2022-08-11 LAB — TROPONIN I (HIGH SENSITIVITY): Troponin I (High Sensitivity): 4 ng/L (ref <18)

## 2022-08-11 NOTE — Discharge Instructions (Addendum)
As we discussed, we recommended a second heart blood test tonight to help fully rule out heart attack.  You may return at any time, especially if your chest pain recurs.  Otherwise you need to follow-up with a primary care physician.  If you develop recurrent, continued, or worsening chest pain, shortness of breath, fever, vomiting, abdominal or back pain, or any other new/concerning symptoms then return to the ER for evaluation.

## 2022-08-11 NOTE — ED Triage Notes (Signed)
Pt BIB [facility]. Pt presents with chest pain that started while smoking marijuana. EMS reports pts pain radiated down L arm and pt has associated HA.   EMS Vitals   190/100 HR 76

## 2022-08-11 NOTE — ED Provider Notes (Signed)
[facility]-EMERGENCY DEPT Provider Note   CSN: [id number] Arrival date & time: 08/11/22  1619     History  Chief Complaint  Patient presents with   Chest Pain    Mark Young is a 41 y.o. male.  HPI 41 year old male presents with chest pain. He states he was here with his significant other earlier in the day hoping she was pregnant but it turned out to be negative. He was trying to calm her down and deal with other stressors. He smoked marijuana and when he coughed hard he noted sudden chest pain. Felt like a gas bubble in infero-lateral chest, then moved up into left breast. Then felt like a sharp/throbbing pain. Started feeling pain in left arm and tingling in both hands/fingers. Felt lightheaded like he was overheated. Doesn't think he had dyspnea, back or abdominal pain. No radiation of the pain. Overall lasted ~30 minutes, ending by the time he got here. EMS gave him baby aspirin. States chest pain is gone but now has a mild headache.   Has a history of hypertension but does not take medicines.  He denies smoking cigarettes, diabetes, hyperlipidemia, family history of early coronary disease.  He denies any recent travel, surgery, leg swelling, or any pleuritic pain.  States has been under a lot of stress both with family and work.  Home Medications Prior to Admission medications   Medication Sig Start Date End Date Taking? Authorizing Provider  cyclobenzaprine (FLEXERIL) 10 MG tablet Take 1 tablet (10 mg total) by mouth 2 (two) times daily as needed for muscle spasms. 05/13/22   Gwyneth Sprout, MD  oxyCODONE (ROXICODONE) 5 MG immediate release tablet Take 1 tablet (5 mg total) by mouth every 6 (six) hours as needed for severe pain. 05/13/22   Gwyneth Sprout, MD      Allergies    Septra [sulfamethoxazole-trimethoprim] and Tylenol [acetaminophen]    Review of Systems   Review of Systems  Respiratory:  Negative for shortness of breath.   Cardiovascular:   Positive for chest pain. Negative for leg swelling.  Gastrointestinal:  Negative for abdominal pain.  Musculoskeletal:  Negative for back pain.  Neurological:  Positive for light-headedness and numbness (tingling).    Physical Exam Updated Vital Signs BP 133/83   Pulse (!) 54   Temp 98 F (36.7 C) (Oral)   Resp 15   Ht 6\' 3"  (1.905 m)   Wt 108.9 kg   SpO2 100%   BMI 30.00 kg/m  Physical Exam Vitals and nursing note reviewed.  Constitutional:      Appearance: He is well-developed.  HENT:     Head: Normocephalic and atraumatic.  Cardiovascular:     Rate and Rhythm: Regular rhythm. Bradycardia present.     Pulses:          Radial pulses are 2+ on the right side and 2+ on the left side.     Heart sounds: Normal heart sounds.  Pulmonary:     Effort: Pulmonary effort is normal.     Breath sounds: Normal breath sounds.  Chest:     Chest wall: Tenderness (mild, left anterior - feels sore) present.  Abdominal:     Palpations: Abdomen is soft.     Tenderness: There is no abdominal tenderness.  Musculoskeletal:     Right lower leg: No tenderness. No edema.     Left lower leg: No tenderness. No edema.  Skin:    General: Skin is warm and dry.  Neurological:  Mental Status: He is alert.     ED Results / Procedures / Treatments   Labs (all labs ordered are listed, but only abnormal results are displayed) Labs Reviewed  CBC - Abnormal; Notable for the following components:      Result Value   Platelets 137 (*)    All other components within normal limits  BASIC METABOLIC PANEL  TROPONIN I (HIGH SENSITIVITY)  TROPONIN I (HIGH SENSITIVITY)    EKG EKG Interpretation  Date/Time:  Wednesday August 11 2022 16:45:04 EDT Ventricular Rate:  50 PR Interval:  188 QRS Duration: 99 QT Interval:  414 QTC Calculation: 378 R Axis:   76 Text Interpretation: Sinus rhythm Consider left atrial enlargement ST elev, probable normal early repol pattern similar to June 2023  Confirmed by Pricilla Loveless [id number]) on 08/11/2022 4:53:22 PM  Radiology DG Chest 2 View  Result Date: 08/11/2022 CLINICAL DATA:  Chest pain EXAM: CHEST - 2 VIEW COMPARISON:  Chest radiograph 10/10/2016 FINDINGS: The cardiomediastinal contours are within normal limits. The lungs are clear. No pneumothorax or pleural effusion. No acute finding in the visualized skeleton. IMPRESSION: No acute cardiopulmonary process. Electronically Signed   By: Emmaline Kluver M.D.   On: 08/11/2022 17:27    Procedures Procedures    Medications Ordered in ED Medications - No data to display  ED Course/ Medical Decision Making/ A&P                           Medical Decision Making Amount and/or Complexity of Data Reviewed Labs: ordered.    Details: Troponin, WBC, hemoglobin and electrolytes are all normal. Radiology: ordered and independent interpretation performed.    Details: No pneumothorax or pneumonia. ECG/medicine tests: independent interpretation performed.    Details: Early repolarization without ischemia.   Patient's chest pain is pretty atypical and I doubt ACS.  He is otherwise well-appearing and his chest pain has remained gone for his entire ER stay.  Unfortunately the second troponin was not collected and now the patient is saying that he needs to go.  Cites a family emergency.  Discussed that given the time of onset of symptoms, would need a second troponin to fully rule out MI though again, this is of lower suspicion.  I also doubt PE and dissection.  He understands that this is an incomplete work-up and was advised of return precautions and need for follow-up.        Final Clinical Impression(s) / ED Diagnoses Final diagnoses:  Nonspecific chest pain    Rx / DC Orders ED Discharge Orders     None         Pricilla Loveless, MD 08/11/22 2009

## 2023-01-13 IMAGING — CT CT CERVICAL SPINE W/O CM
3 of 4 series · 12 of 33 positions shown, 14 images · non-contrast
Comparison: None Available.

CLINICAL DATA: Fell down stairs, head and neck trauma.



[Series 8: sag bone · sagittal · 0.22mm/px · 5 of 61 slices shown, 6 images]
[im 21/61  bone]
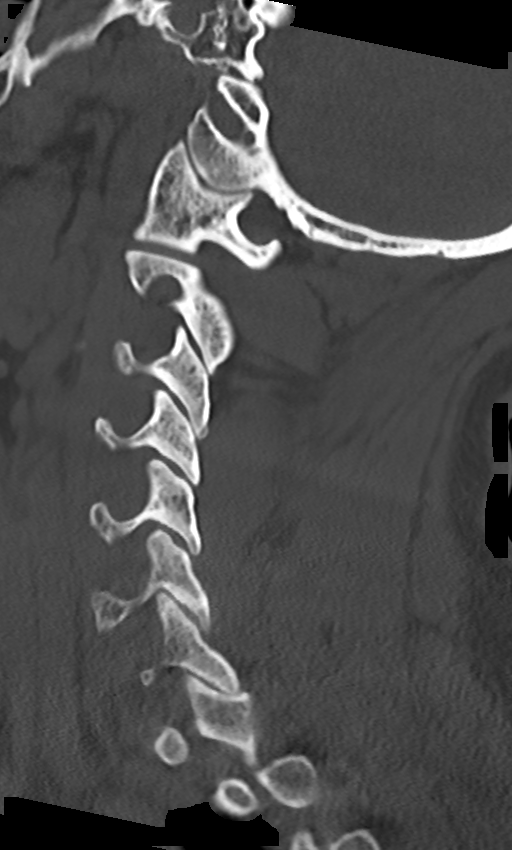
[im 26/61  bone]
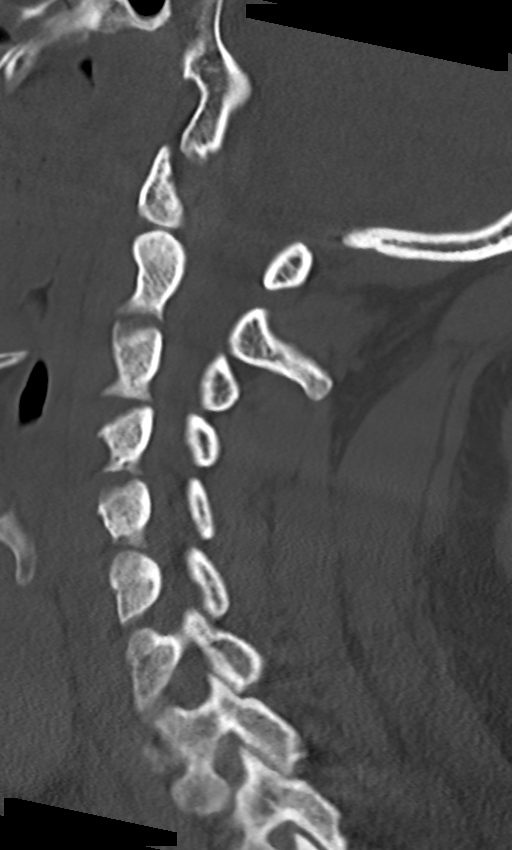
[im 31/61  soft-tissue]
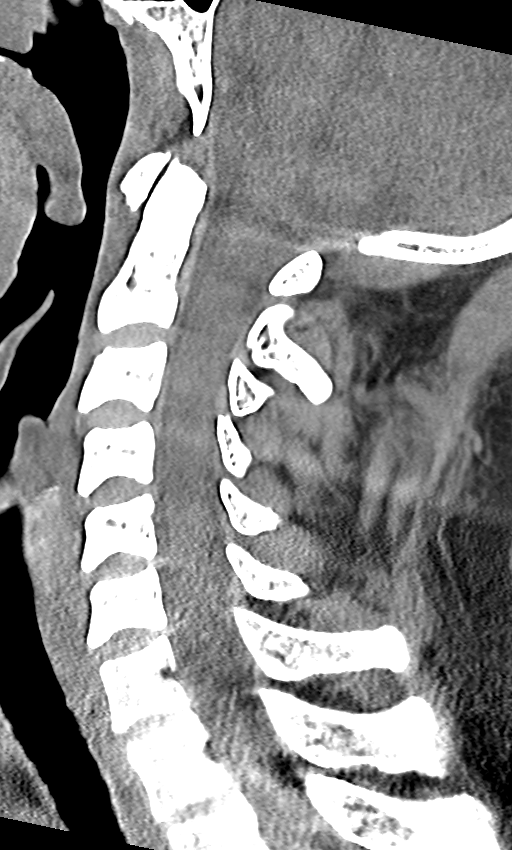
[im 31/61  bone]
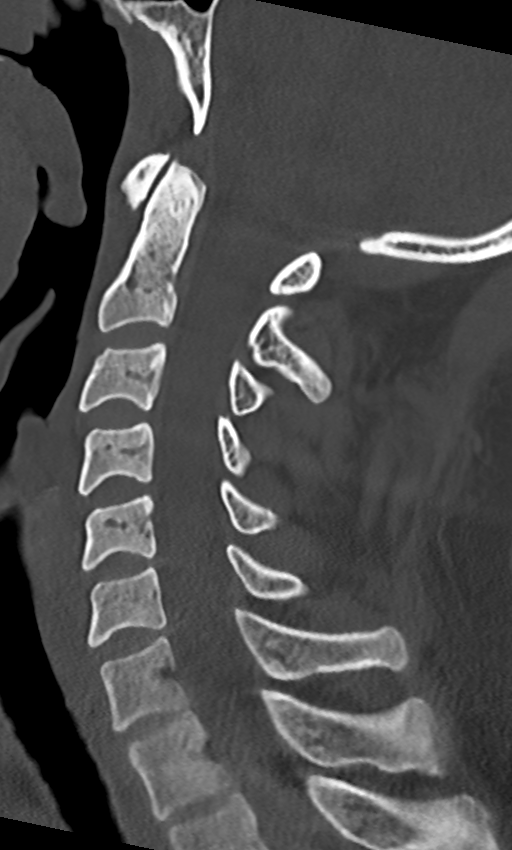
[im 36/61  bone]
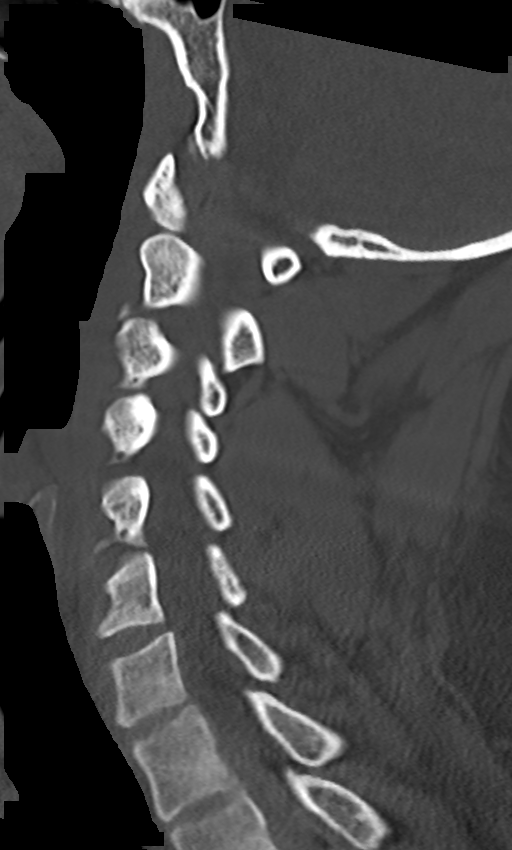
[im 41/61  bone]
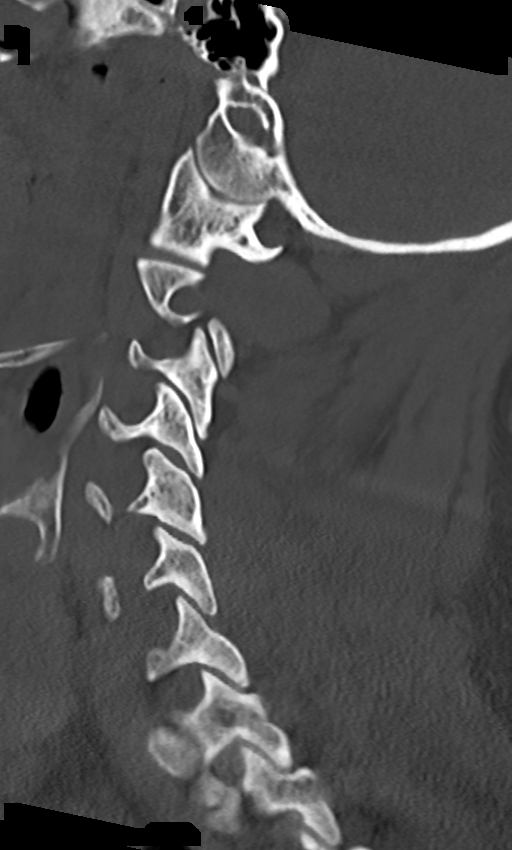

[Series 9: cor bone · coronal · 0.23mm/px · 3 of 57 slices shown]
[im 12/57  bone]
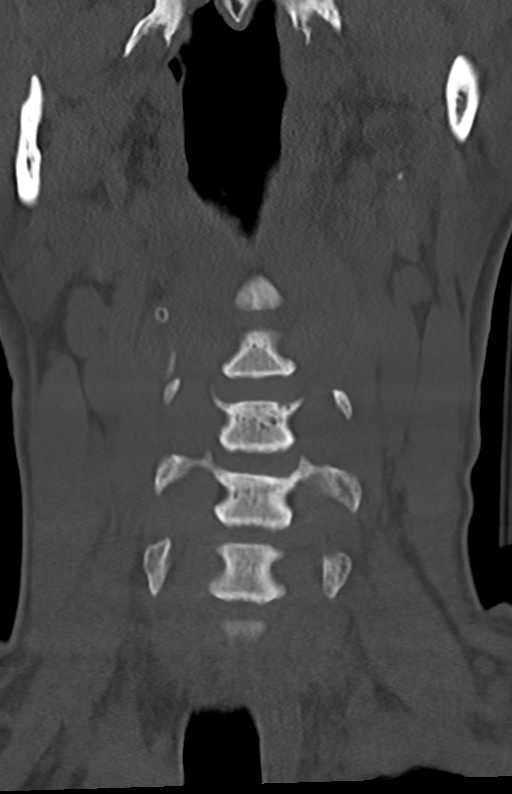
[im 23/57  bone]
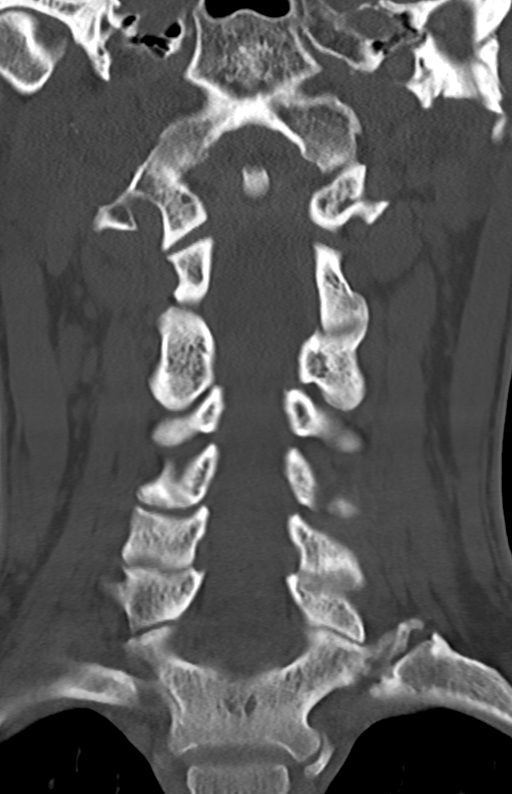
[im 34/57  bone]
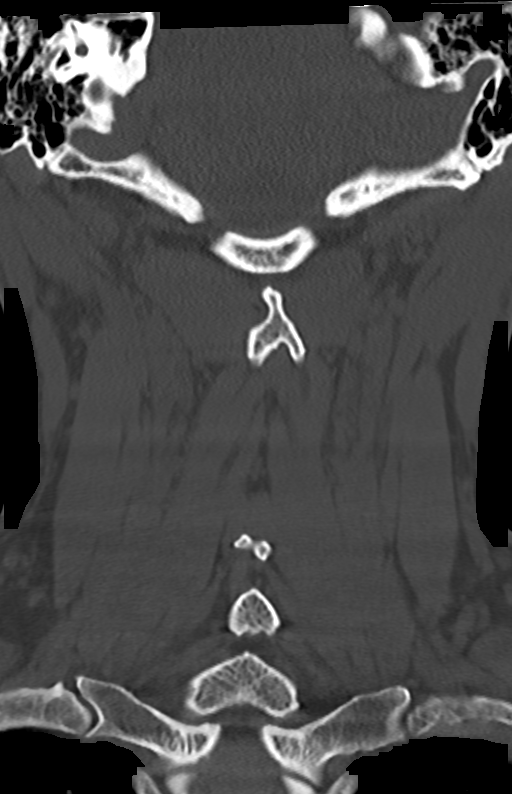

[Series 10: orthogonal axials · axial · 0.21mm/px · z∈[-230,-110]mm · 4 of 88 slices shown, 5 images]
[im 15/88  soft-tissue]
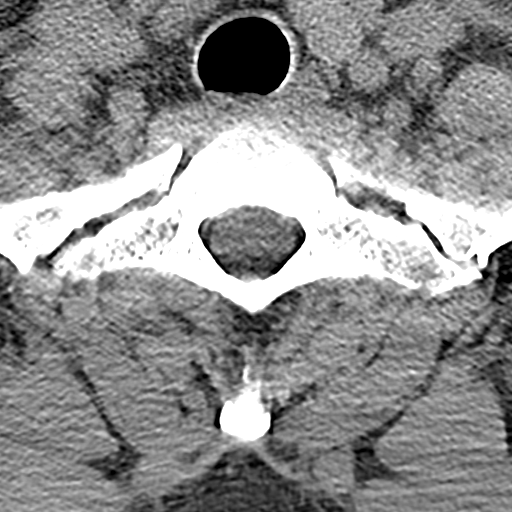
[im 15/88  bone]
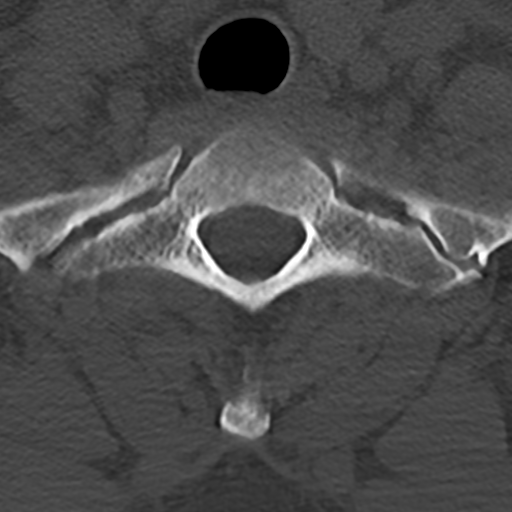
[im 30/88  bone]
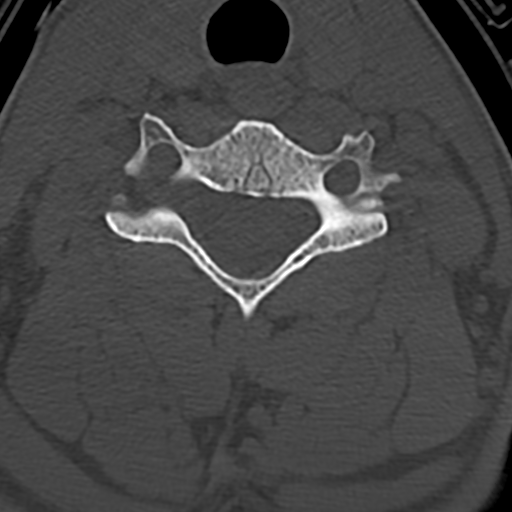
[im 59/88  bone]
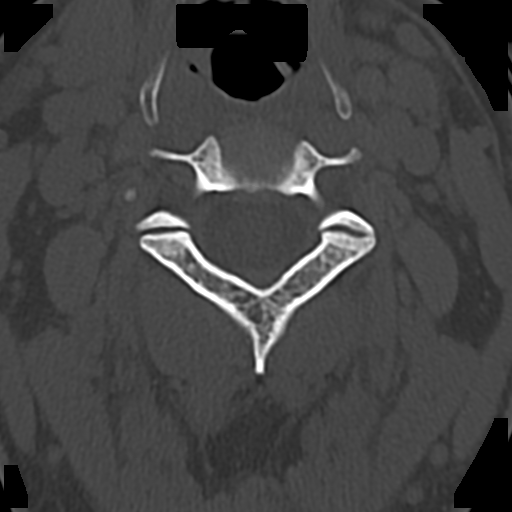
[im 73/88  bone]
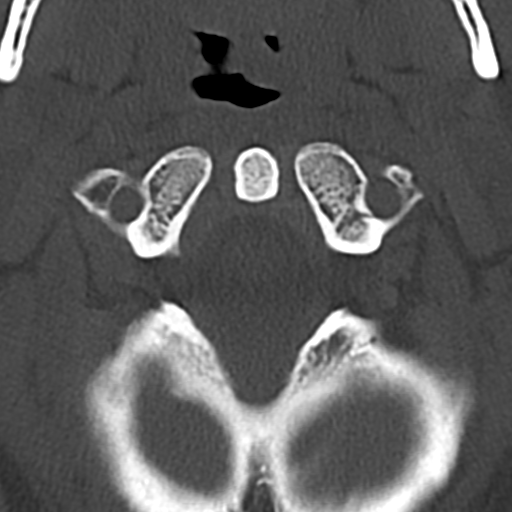

[12 of 33 positions shown; findings below may reference images not displayed]

FINDINGS: CT HEAD FINDINGS

Brain: No evidence of acute infarction, hemorrhage, cerebral edema,
mass, mass effect, or midline shift. No hydrocephalus or extra-axial
fluid collection.

Vascular: No hyperdense vessel.

Skull: Normal. Negative for fracture or focal lesion.

Sinuses/Orbits: Left maxillary mucous retention cyst. Mild mucosal
thickening in the ethmoid air cells. The orbits are unremarkable.

Other: The mastoid air cells are well aerated.

CT CERVICAL SPINE FINDINGS

Alignment: Physiologic.

Skull base and vertebrae: No acute fracture. No primary bone lesion
or focal pathologic process.

Soft tissues and spinal canal: No prevertebral fluid or swelling. No
visible canal hematoma.

Disc levels:  Disc heights are preserved.  No spinal canal stenosis.

Upper chest: Negative.

Other: None.
IMPRESSION: 1.  No acute intracranial process.
2.  No acute fracture or traumatic listhesis in the cervical spine.

## 2023-01-13 IMAGING — CT CT L SPINE W/O CM
3 of 4 series · 14 of 33 positions shown, 17 images · non-contrast
Comparison: None Available.

CLINICAL DATA: Back trauma, no prior imaging (Age >= 16y)



[Series 4: l-spine 2.0 st · axial · 0.39mm/px · z∈[-1076,-870]mm · 8 of 123 slices shown, 10 images]
[im 10/123  soft-tissue]
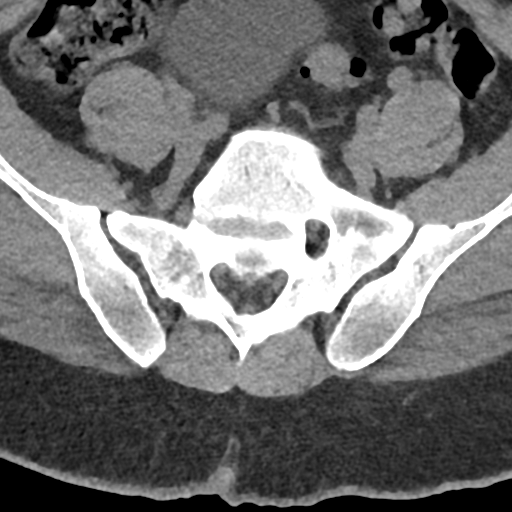
[im 10/123  bone]
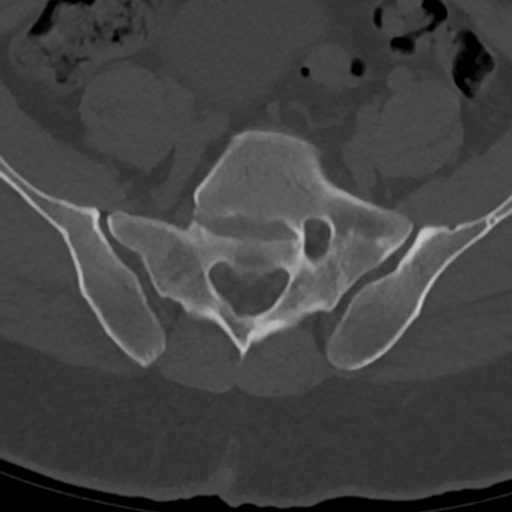
[im 29/123  bone]
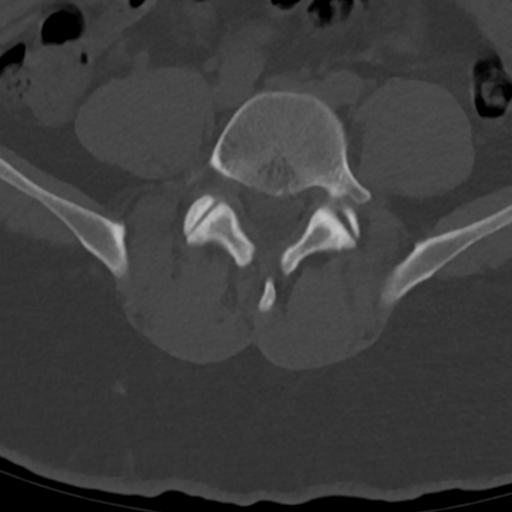
[im 38/123  bone]
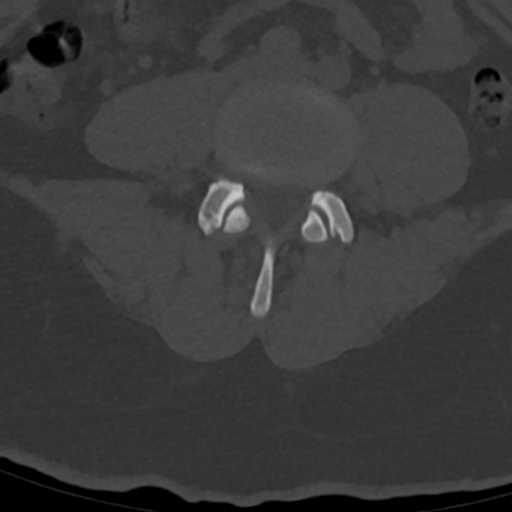
[im 57/123  bone]
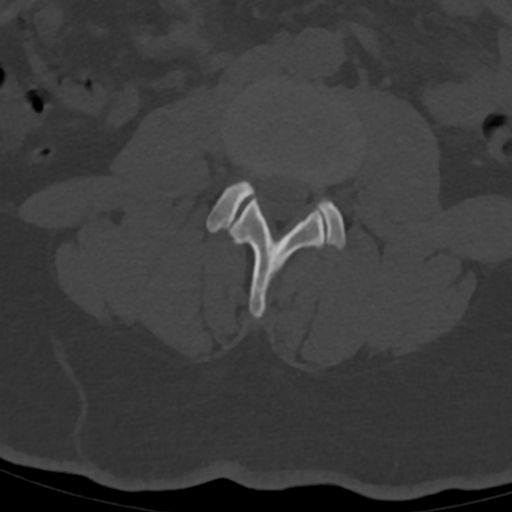
[im 66/123  soft-tissue]
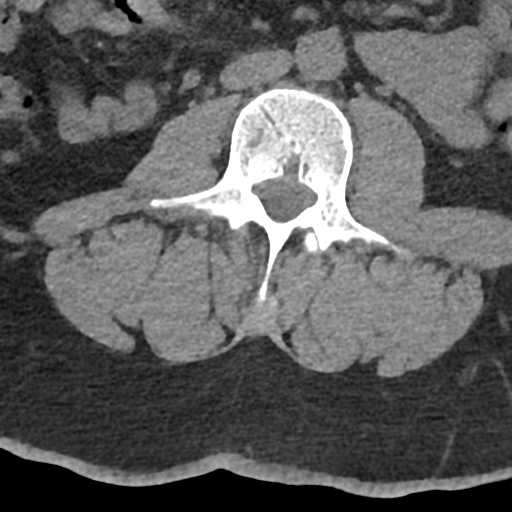
[im 66/123  bone]
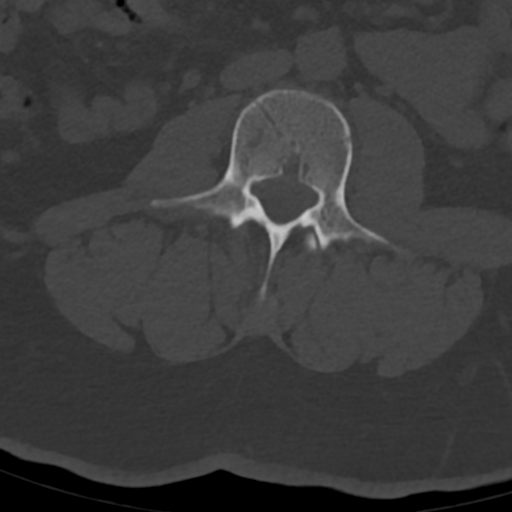
[im 85/123  bone]
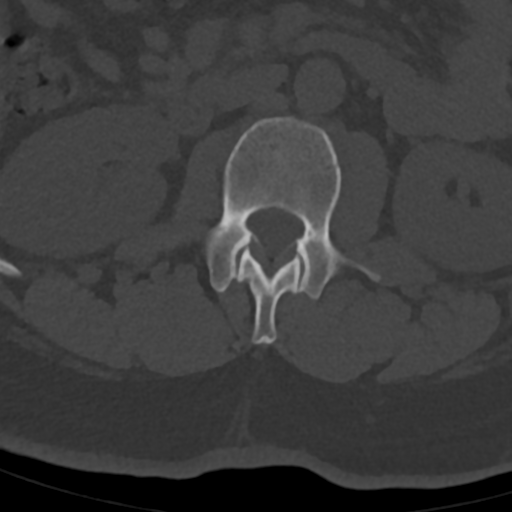
[im 94/123  bone]
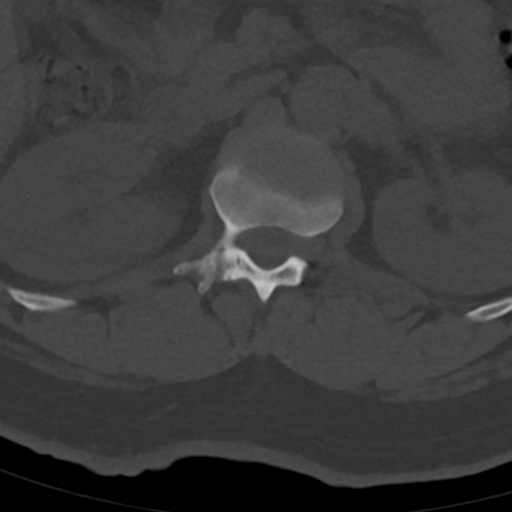
[im 113/123  bone]
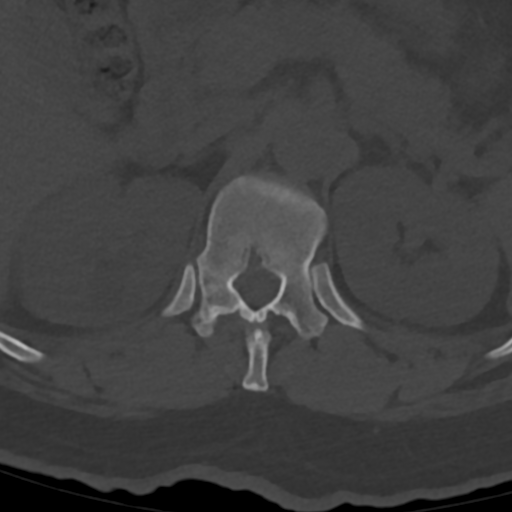

[Series 8: l-spine 2.0 cor bone · coronal · 0.35mm/px · 1 of 86 slices shown]
[im 43/86  bone]
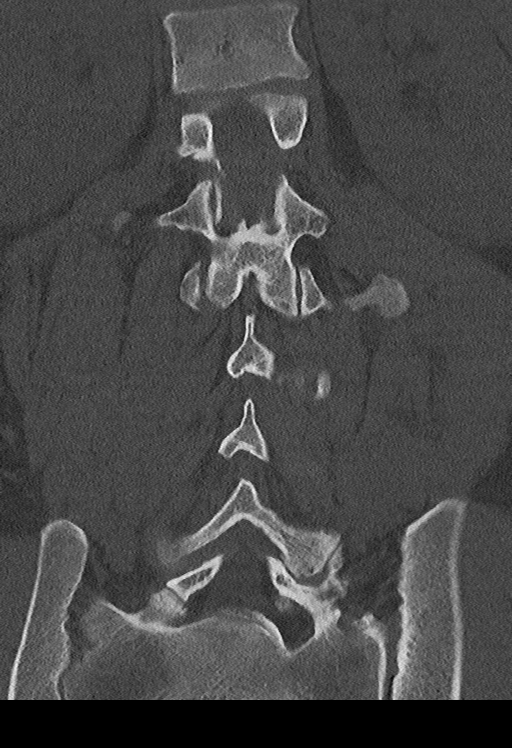

[Series 11: l-spine 2.0 sag · sagittal · 0.35mm/px · 5 of 79 slices shown, 6 images]
[im 27/79  bone]
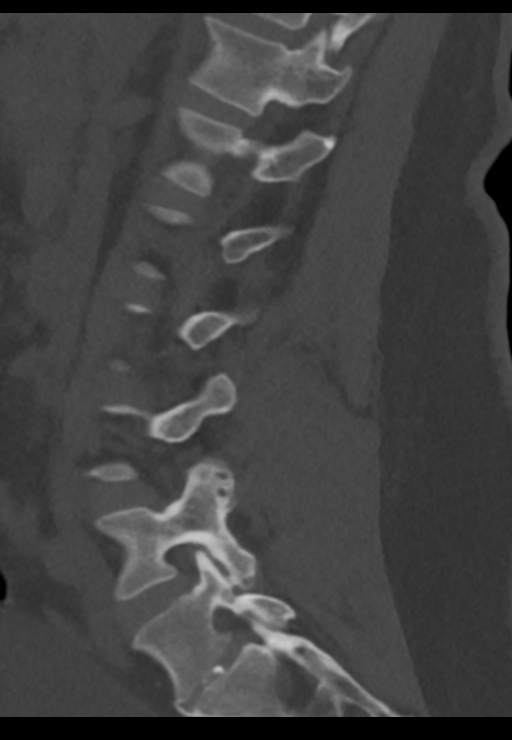
[im 33/79  bone]
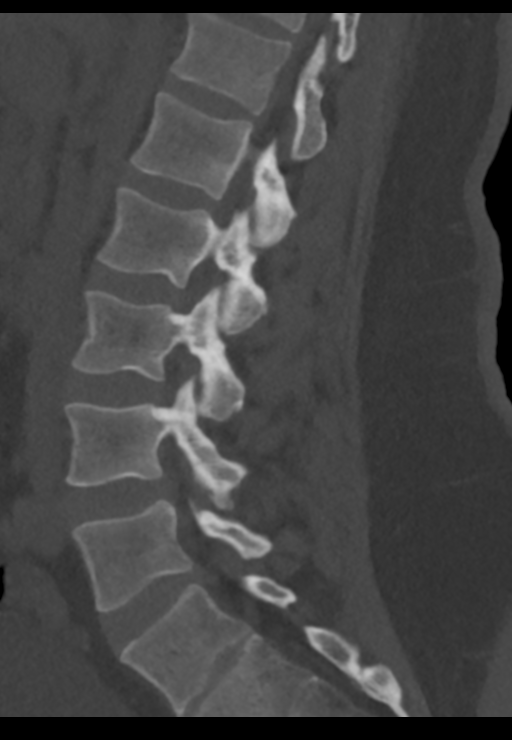
[im 40/79  soft-tissue]
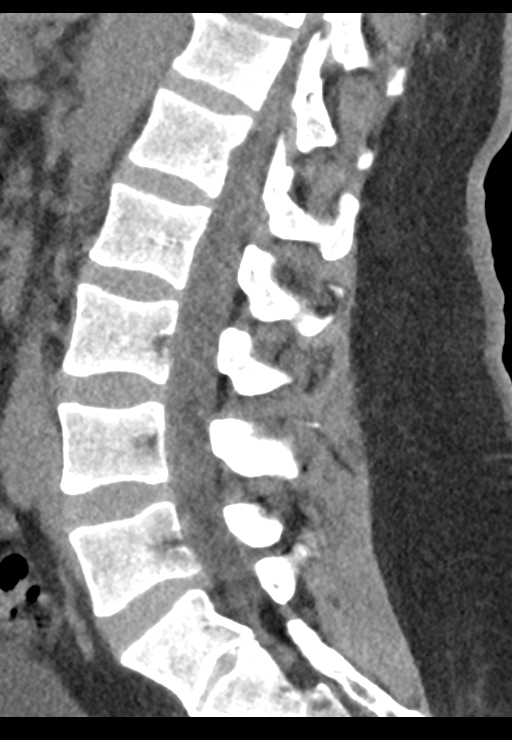
[im 40/79  bone]
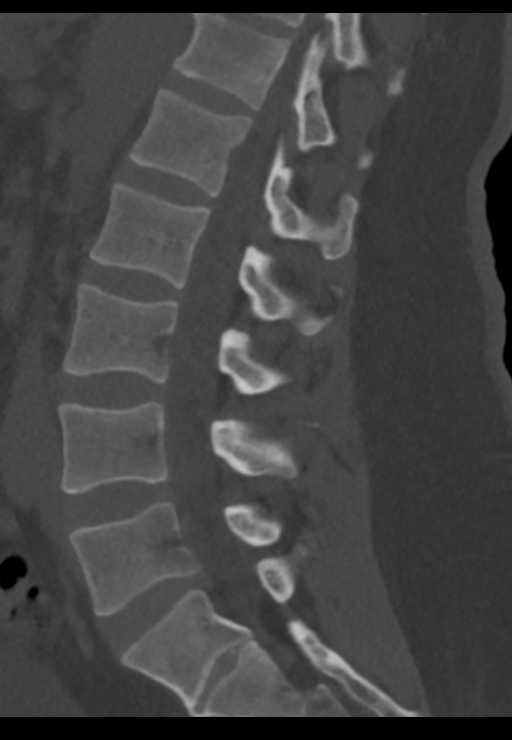
[im 46/79  bone]
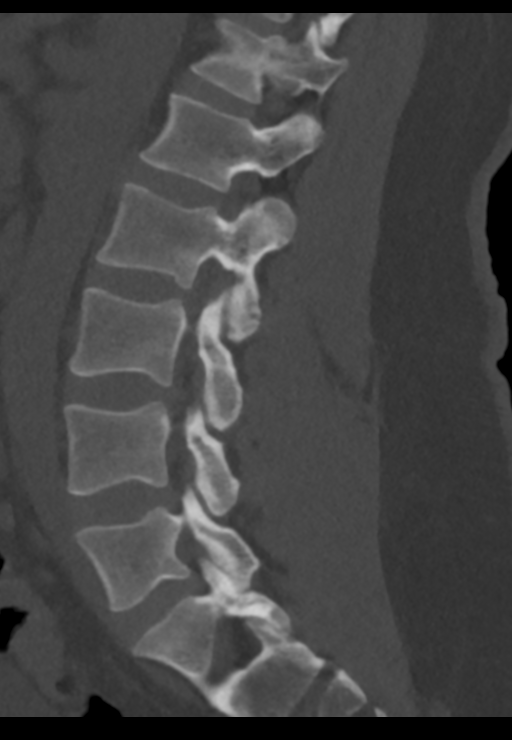
[im 53/79  bone]
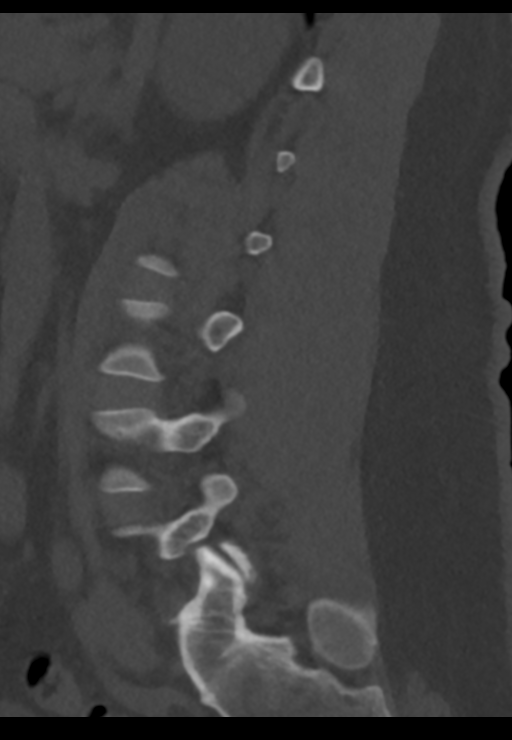

[14 of 33 positions shown; findings below may reference images not displayed]

FINDINGS: Segmentation: Partial lumbarization of S1.

Alignment: Preserved.

Vertebrae: Vertebral body heights are maintained. No acute fracture.

Paraspinal and other soft tissues: Unremarkable.

Disc levels: Intervertebral disc heights are maintained.
IMPRESSION: No acute fracture.

## 2023-01-28 ENCOUNTER — Encounter (HOSPITAL_COMMUNITY): Payer: Self-pay

## 2023-01-28 ENCOUNTER — Other Ambulatory Visit: Payer: Self-pay

## 2023-01-28 ENCOUNTER — Emergency Department (HOSPITAL_COMMUNITY)
Admission: EM | Admit: 2023-01-28 | Discharge: 2023-01-29 | Disposition: A | Discharge status: Home / Self Care | Payer: Self-pay | Attending: Emergency Medicine | Admitting: Emergency Medicine

## 2023-01-28 DIAGNOSIS — M25511 Pain in right shoulder: Secondary | ICD-10-CM | POA: Insufficient documentation

## 2023-01-28 DIAGNOSIS — I10 Essential (primary) hypertension: Secondary | ICD-10-CM | POA: Insufficient documentation

## 2023-01-28 MED ORDER — METHOCARBAMOL 500 MG PO TABS
1000.0000 mg | ORAL_TABLET | Freq: Once | ORAL | Status: AC
  Administered 2023-01-29: 1000 mg via ORAL
  Filled 2023-01-28: qty 2

## 2023-01-28 MED ORDER — KETOROLAC TROMETHAMINE 60 MG/2ML IM SOLN
60.0000 mg | Freq: Once | INTRAMUSCULAR | Status: AC
  Administered 2023-01-29: 60 mg via INTRAMUSCULAR
  Filled 2023-01-28: qty 2

## 2023-01-28 NOTE — ED Provider Notes (Signed)
[facility] Provider Note   CSN: [id number] Arrival date & time: 01/28/23  2228     History {Add pertinent medical, surgical, social history, OB history to HPI:1} Chief Complaint  Patient presents with   Extremity Pain    Mark Young is a 42 y.o. male.  The history is provided by the patient.  Extremity Pain  Mark Young is a 42 y.o. male who presents to the Emergency Department complaining of shoulder pain.  He presents to the emergency department for evaluation of right shoulder pain that started a few days ago.  No definite injuries but he suspects that he hurt it moving a cart at work.  He is right-hand dominant.  He complains of severe and sharp pain to the right shoulder that is worse with movement.  No associated fever, chest pain, shortness of breath, abdominal pain, nausea, vomiting.  He has a history of hypertension.  No additional medical problems.     Home Medications Prior to Admission medications   Medication Sig Start Date End Date Taking? Authorizing Provider  cyclobenzaprine (FLEXERIL) 10 MG tablet Take 1 tablet (10 mg total) by mouth 2 (two) times daily as needed for muscle spasms. 05/13/22   Blanchie Dessert, MD  oxyCODONE (ROXICODONE) 5 MG immediate release tablet Take 1 tablet (5 mg total) by mouth every 6 (six) hours as needed for severe pain. 05/13/22   Blanchie Dessert, MD      Allergies    Septra [sulfamethoxazole-trimethoprim] and Tylenol [acetaminophen]    Review of Systems   Review of Systems  All other systems reviewed and are negative.   Physical Exam Updated Vital Signs BP (!) 168/105 (BP Location: Right Arm)   Pulse (!) 54   Temp 98.7 F (37.1 C) (Oral)   Resp 18   Ht '6\' 2"'$  (1.88 m)   Wt 108.9 kg   SpO2 96%   BMI 30.81 kg/m  Physical Exam Vitals and nursing note reviewed.  Constitutional:      Appearance: He is well-developed.  HENT:     Head: Normocephalic and atraumatic.   Cardiovascular:     Rate and Rhythm: Normal rate and regular rhythm.     Heart sounds: No murmur heard. Pulmonary:     Effort: Pulmonary effort is normal. No respiratory distress.     Breath sounds: Normal breath sounds.  Abdominal:     Palpations: Abdomen is soft.     Tenderness: There is no abdominal tenderness. There is no guarding or rebound.  Musculoskeletal:     Comments: 2+ radial and DP pulses bilaterally.  There is significant tenderness to palpation over the right shoulder, predominantly over the deltoid region.  There is no significant erythema or soft tissue swelling to the shoulder.  There is no bony tenderness over the clavicle or scapula.  Is able to flex and extend and fully range the elbow.  He is unable to fully range the shoulder secondary to pain.  Skin:    General: Skin is warm and dry.  Neurological:     Mental Status: He is alert and oriented to person, place, and time.     Comments: 5 out of 5 grip strength in the right upper extremity.  Sensation to light touch intact throughout the right upper extremity.  Pain limits proximal upper extremity strength testing.  Psychiatric:        Behavior: Behavior normal.     ED Results / Procedures / Treatments   Labs (  all labs ordered are listed, but only abnormal results are displayed) Labs Reviewed - No data to display  EKG None  Radiology No results found.  Procedures Procedures  {Document cardiac monitor, telemetry assessment procedure when appropriate:1}  Medications Ordered in ED Medications  ketorolac (TORADOL) injection 60 mg (has no administration in time range)  methocarbamol (ROBAXIN) tablet 1,000 mg (has no administration in time range)    ED Course/ Medical Decision Making/ A&P   {   Click here for ABCD2, HEART and other calculatorsREFRESH Note before signing :1}                          Medical Decision Making Amount and/or Complexity of Data Reviewed Radiology:  ordered.  Risk Prescription drug management.   ***  {Document critical care time when appropriate:1} {Document review of labs and clinical decision tools ie heart score, Chads2Vasc2 etc:1}  {Document your independent review of radiology images, and any outside records:1} {Document your discussion with family members, caretakers, and with consultants:1} {Document social determinants of health affecting pt's care:1} {Document your decision making why or why not admission, treatments were needed:1} Final Clinical Impression(s) / ED Diagnoses Final diagnoses:  None    Rx / DC Orders ED Discharge Orders     None

## 2023-01-28 NOTE — ED Triage Notes (Signed)
Pain in right shoulder for 3 days radiates down arm and into back. Unable to sleep due to pain. No relief with icy hot, ice packs, or muscle relaxers. Pt states he remembers hurting shoulder at work.

## 2023-01-29 ENCOUNTER — Other Ambulatory Visit (HOSPITAL_COMMUNITY): Payer: Self-pay

## 2023-01-29 ENCOUNTER — Emergency Department (HOSPITAL_COMMUNITY): Payer: Self-pay

## 2023-01-29 MED ORDER — METHOCARBAMOL 500 MG PO TABS: 1000.0000 mg | ORAL_TABLET | Freq: Three times a day (TID) | ORAL | 0 refills | Status: AC | PRN

## 2023-01-29 MED ORDER — NAPROXEN 500 MG PO TABS: 500.0000 mg | ORAL_TABLET | Freq: Two times a day (BID) | ORAL | 0 refills | Status: AC

## 2023-01-29 MED ORDER — ONDANSETRON 4 MG PO TBDP
4.0000 mg | ORAL_TABLET | Freq: Once | ORAL | Status: AC
Start: 2023-01-29 — End: 2023-01-29
  Administered 2023-01-29: 4 mg via ORAL
  Filled 2023-01-29: qty 1

## 2023-01-29 MED ORDER — ONDANSETRON HCL 4 MG PO TABS: 4.0000 mg | ORAL_TABLET | Freq: Three times a day (TID) | ORAL | 0 refills | Status: AC | PRN

## 2023-01-29 NOTE — ED Notes (Signed)
Patient states that he is nauseous, Notified MD

## 2023-11-07 ENCOUNTER — Other Ambulatory Visit: Payer: Self-pay | Admitting: Physical Medicine & Rehabilitation

## 2023-11-07 DIAGNOSIS — M5416 Radiculopathy, lumbar region: Secondary | ICD-10-CM

## 2023-11-07 DIAGNOSIS — M25512 Pain in left shoulder: Secondary | ICD-10-CM

## 2023-11-26 ENCOUNTER — Ambulatory Visit
Admission: RE | Admit: 2023-11-26 | Discharge: 2023-11-26 | Disposition: A | Discharge status: Home / Self Care | Payer: Medicaid Other | Source: Ambulatory Visit | Attending: Physical Medicine & Rehabilitation | Admitting: Physical Medicine & Rehabilitation

## 2023-11-26 DIAGNOSIS — M25512 Pain in left shoulder: Secondary | ICD-10-CM

## 2023-11-26 DIAGNOSIS — M5416 Radiculopathy, lumbar region: Secondary | ICD-10-CM

## 2024-08-30 ENCOUNTER — Ambulatory Visit: Admitting: Podiatry
